# Patient Record
Sex: Female | Born: 1975 | Race: White | Hispanic: No | Marital: Married | Smoking: Current some day smoker
Health system: Southern US, Community
[De-identification: ages and names within clinical notes are randomized; demographics above are authoritative.]

## PROBLEM LIST (undated history)

## (undated) DIAGNOSIS — N939 Abnormal uterine and vaginal bleeding, unspecified: Secondary | ICD-10-CM

## (undated) HISTORY — PX: TONSILLECTOMY: SUR1361

## (undated) HISTORY — DX: Abnormal uterine and vaginal bleeding, unspecified: N93.9

## (undated) HISTORY — PX: PARATHYROIDECTOMY: SHX19

---

## 2014-02-19 ENCOUNTER — Other Ambulatory Visit: Payer: Self-pay

## 2014-02-19 DIAGNOSIS — Z1231 Encounter for screening mammogram for malignant neoplasm of breast: Secondary | ICD-10-CM

## 2014-03-05 ENCOUNTER — Ambulatory Visit: Payer: Self-pay

## 2015-05-20 ENCOUNTER — Emergency Department
Admission: EM | Admit: 2015-05-20 | Discharge: 2015-05-20 | Disposition: A | Discharge status: Home / Self Care | Payer: Worker's Compensation | Attending: Emergency Medicine | Admitting: Emergency Medicine

## 2015-05-20 ENCOUNTER — Emergency Department: Payer: Worker's Compensation

## 2015-05-20 ENCOUNTER — Encounter: Payer: Self-pay | Admitting: Emergency Medicine

## 2015-05-20 DIAGNOSIS — Y9389 Activity, other specified: Secondary | ICD-10-CM | POA: Diagnosis not present

## 2015-05-20 DIAGNOSIS — Y92531 Health care provider office as the place of occurrence of the external cause: Secondary | ICD-10-CM | POA: Insufficient documentation

## 2015-05-20 DIAGNOSIS — Z23 Encounter for immunization: Secondary | ICD-10-CM | POA: Diagnosis not present

## 2015-05-20 DIAGNOSIS — Y990 Civilian activity done for income or pay: Secondary | ICD-10-CM | POA: Insufficient documentation

## 2015-05-20 DIAGNOSIS — W460XXA Contact with hypodermic needle, initial encounter: Secondary | ICD-10-CM | POA: Diagnosis not present

## 2015-05-20 DIAGNOSIS — Z7721 Contact with and (suspected) exposure to potentially hazardous body fluids: Secondary | ICD-10-CM

## 2015-05-20 DIAGNOSIS — S71111A Laceration without foreign body, right thigh, initial encounter: Secondary | ICD-10-CM | POA: Insufficient documentation

## 2015-05-20 DIAGNOSIS — F17200 Nicotine dependence, unspecified, uncomplicated: Secondary | ICD-10-CM | POA: Diagnosis not present

## 2015-05-20 LAB — POCT PREGNANCY, URINE: Preg Test, Ur: NEGATIVE

## 2015-05-20 MED ORDER — TETANUS-DIPHTH-ACELL PERTUSSIS 5-2.5-18.5 LF-MCG/0.5 IM SUSP
0.5000 mL | Freq: Once | INTRAMUSCULAR | Status: AC
  Administered 2015-05-20: 0.5 mL via INTRAMUSCULAR
  Filled 2015-05-20: qty 0.5

## 2015-05-20 MED ORDER — BACITRACIN-NEOMYCIN-POLYMYXIN 400-5-5000 EX OINT
TOPICAL_OINTMENT | CUTANEOUS | Status: AC
  Filled 2015-05-20: qty 1

## 2015-05-20 MED ORDER — MUPIROCIN 2 % EX OINT: 1.0000 "application " | TOPICAL_OINTMENT | Freq: Two times a day (BID) | CUTANEOUS | Status: DC

## 2015-05-20 NOTE — ED Notes (Signed)
Pt completed w/c COC and gave urine specimen, this tech hand delivered specimen to lab and signed in; pt given her copy and employer copy of COC as well

## 2015-05-20 NOTE — Discharge Instructions (Signed)
Body Fluid Exposure Information °People may come into contact with blood and other body fluids under various circumstances. In some cases, body fluids may contain germs (bacteria or viruses) that cause infections. These germs can be spread when another person's body fluids come into contact with your skin, mouth, eyes, or genitals.  °Exposure to body fluids that may contain infectious material is a common problem for people providing care for others who are ill. It can occur when a person is performing health care tasks in the workplace or when taking care of a family member at home. Other common methods of exposure include injection drug use, sharing needles, and sexual activity. °The risk of an infection spreading through body fluid exposure is small and depends on a variety of factors. This includes the type of body fluid, the nature of the exposure, and the health status of the person who was the source of the body fluids. Your health care provider can help you assess the risk. °WHAT TYPES OF BODY FLUID CAN SPREAD INFECTION? °The following types of body fluid have the potential to spread infections: °· Blood. °· Semen. °· Vaginal secretions. °· Urine. °· Feces. °· Saliva. °· Nasal or eye discharge. °· Breast milk. °· Amniotic fluid and fluids surrounding body organs. °WHAT ARE SOME FIRST-AID MEASURES FOR BODY FLUID EXPOSURE? °The following steps should be taken as soon as possible after a person is exposed to body fluids: °Intact Skin °· For contact with closed skin, wash the area with soap and water. °Broken Skin °· For contact with broken skin (a wound), wash the area with soap and water. Let the area bleed a little. Then place a bandage or clean towel on the wound, applying gentle pressure to stop the bleeding. Do not squeeze or rub the area. °· Use just water or hand sanitizer if a sink with soap is not available. °· Do not use harsh chemicals such as bleach or iodine. °Eyes °· Rinse the eyes with water or  saline for 30 seconds. °· If the person is wearing contact lenses, leave the contact lenses in while rinsing the eyes. Once the rinsing is complete, remove the contact lenses. °Mouth °· Spit out the fluids. Rinse and spit with water 4-5 times. °In addition, you should remove any clothing that comes into contact with body fluids. However, if body fluid exposure results from sexual assault, seek medical care immediately without changing clothes or bathing. °WHEN SHOULD YOU SEEK HELP? °After performing the proper first-aid steps, you should contact your health care provider or seek emergency care right away if blood or other body fluids made contact with areas of broken skin or openings such as the eyes or mouth. If the exposure to body fluid happened in the workplace, you should report it to your work supervisor immediately. Many workplaces have procedures in place for exposure situations. °WHAT WILL HAPPEN AFTER YOU REPORT THE EXPOSURE? °Your health care provider will ask you several questions. Information requested may include: °· Your medical history, including vaccination records. °· Date and time of the exposure. °· Whether you saw body fluids during the exposure. °· Type of body fluid you were exposed to. °· Volume of body fluid you were exposed to. °· How the exposure happened. °· If any devices, such as a needle, were being used. °· Which area of your body made contact with the body fluid. °· Description of any injury to the skin or other area. °· How long contact was made with the body   fluid.  Any information you have about the health status of the person whose body fluid you were exposed to. The health care provider will assess your risk of infection. Often, no treatment is necessary. In some cases, the health care provider may recommend doing blood tests right away. Follow-up blood tests may also be done at certain intervals during the upcoming weeks and months to check for changes. You may be offered  treatment to prevent an infection from developing after exposure (post-exposure prophylaxis). This may include certain vaccinations or medicines and may be necessary when there is a risk of a serious infection, such as HIV or hepatitis B. Your health care provider should discuss appropriate treatment and vaccinations with you. HOW CAN YOU PREVENT EXPOSURE AND INFECTION? Always remember that prevention is the first line of defense against body fluid exposure. To help prevent exposure to body fluids:  Wash and disinfect countertops and other surfaces regularly.  Wear appropriate protective gear such as gloves, gowns, or eyewear when the possibility of exposure is present.  Wipe away spills of body fluid with disposable towels.  Properly dispose of blood products and other fluids. Use secured bags.  Properly dispose of needles and other instruments with sharp points or edges (sharps). Use closed, marked containers.  Avoid injection drug use.  Do not share needles.  Avoid recapping needles.  Use a condom during sexual intercourse.  Make sure you learn and follow any guidelines for preventing exposure (universal precautions) provided at your workplace. To help reduce your chances of getting an infection:  Make sure your vaccinations are up-to-date, including those for tetanus and hepatitis.  Wash your hands frequently with soap and water. Use hand sanitizers.  Avoid having multiple sex partners.  Follow up with your health care provider as directed after being evaluated for an exposure to body fluids. To avoid spreading infection to others:  Do not have sexual relations until you know you are free of infection.  Do not donate blood, plasma, breast milk, sperm, or other body fluids.  Do not share hygiene tools such as toothbrushes, razors, or dental floss.  Keep open wounds covered.  Dispose of any items with blood on them (razors, tampons, bandages) by putting them in the  trash.  Do not share drug supplies with others, such as needles, syringes, straws, or pipes.  Follow all of your health care provider's instructions for preventing the spread of infection.   This information is not intended to replace advice given to you by your health care provider. Make sure you discuss any questions you have with your health care provider.   Document Released: 09/14/2012 Document Revised: 01/17/2013 Document Reviewed: 09/14/2012 Elsevier Interactive Patient Education 2016 [employer] Inc[employer]  Laceration Care, Adult A laceration is a cut that goes through all of the layers of the skin and into the tissue that is right under the skin. Some lacerations heal on their own. Others need to be closed with stitches (sutures), staples, skin adhesive strips, or skin glue. Proper laceration care minimizes the risk of infection and helps the laceration to heal better. HOW TO CARE FOR YOUR LACERATION If sutures or staples were used:  Keep the wound clean and dry.  If you were given a bandage (dressing), you should change it at least one time per day or as told by your health care provider. You should also change it if it becomes wet or dirty.  Keep the wound completely dry for the first 24 hours or as told  by your health care provider. After that time, you may shower or bathe. However, make sure that the wound is not soaked in water until after the sutures or staples have been removed.  Clean the wound one time each day or as told by your health care provider:  Wash the wound with soap and water.  Rinse the wound with water to remove all soap.  Pat the wound dry with a clean towel. Do not rub the wound.  After cleaning the wound, apply a thin layer of antibiotic ointmentas told by your health care provider. This will help to prevent infection and keep the dressing from sticking to the wound.  Have the sutures or staples removed as told by your health care provider. If skin adhesive  strips were used:  Keep the wound clean and dry.  If you were given a bandage (dressing), you should change it at least one time per day or as told by your health care provider. You should also change it if it becomes dirty or wet.  Do not get the skin adhesive strips wet. You may shower or bathe, but be careful to keep the wound dry.  If the wound gets wet, pat it dry with a clean towel. Do not rub the wound.  Skin adhesive strips fall off on their own. You may trim the strips as the wound heals. Do not remove skin adhesive strips that are still stuck to the wound. They will fall off in time. If skin glue was used:  Try to keep the wound dry, but you may briefly wet it in the shower or bath. Do not soak the wound in water, such as by swimming.  After you have showered or bathed, gently pat the wound dry with a clean towel. Do not rub the wound.  Do not do any activities that will make you sweat heavily until the skin glue has fallen off on its own.  Do not apply liquid, cream, or ointment medicine to the wound while the skin glue is in place. Using those may loosen the film before the wound has healed.  If you were given a bandage (dressing), you should change it at least one time per day or as told by your health care provider. You should also change it if it becomes dirty or wet.  If a dressing is placed over the wound, be careful not to apply tape directly over the skin glue. Doing that may cause the glue to be pulled off before the wound has healed.  Do not pick at the glue. The skin glue usually remains in place for 5-10 days, then it falls off of the skin. General Instructions  Take over-the-counter and prescription medicines only as told by your health care provider.  If you were prescribed an antibiotic medicine or ointment, take or apply it as told by your doctor. Do not stop using it even if your condition improves.  To help prevent scarring, make sure to cover your wound  with sunscreen whenever you are outside after stitches are removed, after adhesive strips are removed, or when glue remains in place and the wound is healed. Make sure to wear a sunscreen of at least 30 SPF.  Do not scratch or pick at the wound.  Keep all follow-up visits as told by your health care provider. This is important.  Check your wound every day for signs of infection. Watch for:  Redness, swelling, or pain.  Fluid, blood, or pus.  Raise (elevate) the injured area above the level of your heart while you are sitting or lying down, if possible. SEEK MEDICAL CARE IF:  You received a tetanus shot and you have swelling, severe pain, redness, or bleeding at the injection site.  You have a fever.  A wound that was closed breaks open.  You notice a bad smell coming from your wound or your dressing.  You notice something coming out of the wound, such as wood or glass.  Your pain is not controlled with medicine.  You have increased redness, swelling, or pain at the site of your wound.  You have fluid, blood, or pus coming from your wound.  You notice a change in the color of your skin near your wound.  You need to change the dressing frequently due to fluid, blood, or pus draining from the wound.  You develop a new rash.  You develop numbness around the wound. SEEK IMMEDIATE MEDICAL CARE IF:  You develop severe swelling around the wound.  Your pain suddenly increases and is severe.  You develop painful lumps near the wound or on skin that is anywhere on your body.  You have a red streak going away from your wound.  The wound is on your hand or foot and you cannot properly move a finger or toe.  The wound is on your hand or foot and you notice that your fingers or toes look pale or bluish.   This information is not intended to replace advice given to you by your health care provider. Make sure you discuss any questions you have with your health care provider.    Document Released: 01/12/2005 Document Revised: 05/29/2014 Document Reviewed: 01/08/2014 Elsevier Interactive Patient Education [employer]

## 2015-05-20 NOTE — ED Provider Notes (Signed)
[facility] Emergency Department Provider Note  ____________________________________________  Time seen: Approximately 11:23 AM  I have reviewed the triage vital signs and the nursing notes.   HISTORY  Chief Complaint Puncture Wound    HPI Helen Curry is a 40 y.o. female , NAD, presents to the emergency department with complaint of a puncture wound to the right thigh. States she works in a [employer] office and walked into a used before. States that he "was used on the patient and had not been sanitizing that time. Also notes that she cannot find the needle from the bur but doesn't believe it is in her leg. Notes that her tetanus is not up-to-date. Has not had any numbness, weakness, tingling of the leg. Denies any previous exposures.   No past medical history on file.  There are no active problems to display for this patient.   Past Surgical History  Procedure Laterality Date  . Parathyroidectomy    . Tonsillectomy      Current Outpatient Rx  Name  Route  Sig  Dispense  Refill  . mupirocin ointment (BACTROBAN) 2 %   Topical   Apply 1 application topically 2 (two) times daily.   30 g   0     Allergies Review of patient's allergies indicates no known allergies.  No family history on file.  Social History Social History  Substance Use Topics  . Smoking status: Current Some Day Smoker  . Smokeless tobacco: None  . Alcohol Use: Yes     Review of Systems  Constitutional: No fever/chills, fatigue, no night sweats Cardiovascular: No chest pain. Respiratory: No shortness of breath. Musculoskeletal: Negative for right hip or leg pain.  Skin: Positive laceration to right thigh. Negative for rash. Neurological: Negative for headaches, focal weakness or numbness. No tingling 10-point ROS otherwise negative.  ____________________________________________   PHYSICAL EXAM:  VITAL SIGNS: ED Triage Vitals  Enc Vitals Group     BP 05/20/15  1106 114/75 mmHg     Pulse Rate 05/20/15 1106 65     Resp 05/20/15 1106 20     Temp 05/20/15 1106 97.9 F (36.6 C)     Temp Source 05/20/15 1106 Oral     SpO2 05/20/15 1106 98 %     Weight 05/20/15 1106 210 lb (95.255 kg)     Height 05/20/15 1106 5\' 10"  (1.778 m)     Head Cir --      Peak Flow --      Pain Score --      Pain Loc --      Pain Edu? --      Excl. in GC? --      Constitutional: Alert and oriented. Well appearing and in no acute distress. Eyes: Conjunctivae are normal. Head: Atraumatic. Neck: Supple with full range of motion. Hematological/Lymphatic/Immunilogical: No cervical lymphadenopathy. Cardiovascular: Normal rate, regular rhythm. Normal S1 and S2.  Good peripheral circulation with 2+ pulses noted in the right lower extremity. Respiratory: Normal respiratory effort without tachypnea or retractions. Lungs CTAB with breath sounds noted in all lung fields. Musculoskeletal: No lower extremity tenderness nor edema.  No joint effusions. Neurologic:  Normal speech and language. No gross focal neurologic deficits are appreciated.  Skin:  1 cm superficial laceration to the right anterior thigh. No active bleeding. No evidence of foreign body to palpation. Skin is warm, dry. No rash noted. Psychiatric: Mood and affect are normal. Speech and behavior are normal. Patient exhibits appropriate insight and judgement.  ____________________________________________   LABS (all labs ordered are listed, but only abnormal results are displayed)  Labs Reviewed  POCT PREGNANCY, URINE   ____________________________________________  EKG  None ____________________________________________  RADIOLOGY I have personally viewed and evaluated these images (plain radiographs) as part of my medical decision making, as well as reviewing the written report by the radiologist.  Dg Femur, Min 2 Views Right  05/20/2015  CLINICAL DATA:  Foreign body in proximal thigh. EXAM: RIGHT FEMUR 2  VIEWS COMPARISON:  None. FINDINGS: No acute fracture. No dislocation. No evidence of radiopaque foreign body in the soft tissues. IMPRESSION: No acute bony pathology. No evidence of radiopaque foreign body in the soft tissues. Electronically Signed   By: Jolaine Click M.D.   On: 05/20/2015 13:47    ____________________________________________    PROCEDURES  Procedure(s) performed: None    Medications  neomycin-bacitracin-polymyxin (NEOSPORIN) [id number] ointment (not administered)  Tdap (BOOSTRIX) injection 0.5 mL (0.5 mLs Intramuscular Given 05/20/15 1320)     ____________________________________________   INITIAL IMPRESSION / ASSESSMENT AND PLAN / ED COURSE  ----------------------------------------- 1:17 PM on 05/20/2015 -----------------------------------------  We have been notified that the source patient has been informed and is on her way to the emergency department for blood to be drawn.  ----------------------------------------- 3:05 PM on 05/20/2015 -----------------------------------------  Blood has been drawn from the source and has been sent to lab for evaluation.  Pertinent labs & imaging results that were available during my care of the patient were reviewed by me and considered in my medical decision making (see chart for details).  Patient's diagnosis is consistent with exposure to blood and laceration to right thigh. Patient will be discharged home with prescriptions for Bactroban to use as directed. Patient is to keep wound clean and dry. May wash with warm soapy water and apply ointment and a bandage up to twice daily.. Patient is to follow up with [facility] as needed. Patient is given ED precautions to return to the ED for any worsening or new symptoms.      ____________________________________________  FINAL CLINICAL IMPRESSION(S) / ED DIAGNOSES  Final diagnoses:  Exposure to blood  Laceration of right thigh without complication,  initial encounter      NEW MEDICATIONS STARTED DURING THIS VISIT:  New Prescriptions   MUPIROCIN OINTMENT (BACTROBAN) 2 %    Apply 1 application topically 2 (two) times daily.         Hope Pigeon, PA-C 05/20/15 1507  Myrna Blazer, MD 05/20/15 [phone]

## 2015-05-20 NOTE — ED Notes (Signed)
Working at [employer] office, walked into burr, punctured R thigh, states equipment had been used on patient already.

## 2015-05-20 NOTE — ED Notes (Signed)
Pt  Employer is [employer] [facility] [facility] [employer], no profile sheet on file contacted Rivka BarbaraGlenda the office manager @ [phone], who stated to please complete urine drug screen

## 2015-05-20 NOTE — ED Notes (Signed)
Pt source has resulted nonreactive. Patient has been counseled by provider and opts for non prophylaxis. Patient given follow up schedule for further testing. Discharged.

## 2015-05-20 NOTE — ED Notes (Signed)
POCT preg negative. 

## 2016-08-04 ENCOUNTER — Telehealth: Payer: Self-pay

## 2016-08-04 NOTE — Telephone Encounter (Signed)
Pt has appt Fri and would like to have lab orders put in so she can have them done that day also.  Left detailed msg that labs will be put in while she's here and if she is fasting she can go ahead an have labs drawn.

## 2016-08-07 ENCOUNTER — Encounter: Payer: Self-pay | Admitting: Obstetrics and Gynecology

## 2016-08-07 ENCOUNTER — Ambulatory Visit (INDEPENDENT_AMBULATORY_CARE_PROVIDER_SITE_OTHER): Payer: No Typology Code available for payment source | Admitting: Obstetrics and Gynecology

## 2016-08-07 VITALS — BP 112/76 | HR 73 | Ht 70.0 in | Wt 216.0 lb

## 2016-08-07 DIAGNOSIS — Z1231 Encounter for screening mammogram for malignant neoplasm of breast: Secondary | ICD-10-CM | POA: Diagnosis not present

## 2016-08-07 DIAGNOSIS — Z13220 Encounter for screening for lipoid disorders: Secondary | ICD-10-CM

## 2016-08-07 DIAGNOSIS — E892 Postprocedural hypoparathyroidism: Secondary | ICD-10-CM

## 2016-08-07 DIAGNOSIS — Z124 Encounter for screening for malignant neoplasm of cervix: Secondary | ICD-10-CM | POA: Diagnosis not present

## 2016-08-07 DIAGNOSIS — N920 Excessive and frequent menstruation with regular cycle: Secondary | ICD-10-CM

## 2016-08-07 DIAGNOSIS — Z01419 Encounter for gynecological examination (general) (routine) without abnormal findings: Secondary | ICD-10-CM

## 2016-08-07 DIAGNOSIS — Z131 Encounter for screening for diabetes mellitus: Secondary | ICD-10-CM | POA: Diagnosis not present

## 2016-08-07 DIAGNOSIS — Z9889 Other specified postprocedural states: Secondary | ICD-10-CM

## 2016-08-07 DIAGNOSIS — Z1239 Encounter for other screening for malignant neoplasm of breast: Secondary | ICD-10-CM

## 2016-08-07 DIAGNOSIS — Z01411 Encounter for gynecological examination (general) (routine) with abnormal findings: Secondary | ICD-10-CM

## 2016-08-07 DIAGNOSIS — Z9089 Acquired absence of other organs: Secondary | ICD-10-CM

## 2016-08-07 DIAGNOSIS — Z1329 Encounter for screening for other suspected endocrine disorder: Secondary | ICD-10-CM

## 2016-08-07 MED ORDER — PHENTERMINE HCL 37.5 MG PO TABS: 37.5000 mg | ORAL_TABLET | Freq: Every day | ORAL | 0 refills | Status: DC

## 2016-08-07 NOTE — Patient Instructions (Signed)
Preventive Care 40-64 Years, Female Preventive care refers to lifestyle choices and visits with your health care provider that can promote health and wellness. What does preventive care include?  A yearly physical exam. This is also called an annual well check.  Dental exams once or twice a year.  Routine eye exams. Ask your health care provider how often you should have your eyes checked.  Personal lifestyle choices, including: ? Daily care of your teeth and gums. ? Regular physical activity. ? Eating a healthy diet. ? Avoiding tobacco and drug use. ? Limiting alcohol use. ? Practicing safe sex. ? Taking low-dose aspirin daily starting at age 58. ? Taking vitamin and mineral supplements as recommended by your health care provider. What happens during an annual well check? The services and screenings done by your health care provider during your annual well check will depend on your age, overall health, lifestyle risk factors, and family history of disease. Counseling Your health care provider may ask you questions about your:  Alcohol use.  Tobacco use.  Drug use.  Emotional well-being.  Home and relationship well-being.  Sexual activity.  Eating habits.  Work and work Statistician.  Method of birth control.  Menstrual cycle.  Pregnancy history.  Screening You may have the following tests or measurements:  Height, weight, and BMI.  Blood pressure.  Lipid and cholesterol levels. These may be checked every 5 years, or more frequently if you are over 81 years old.  Skin check.  Lung cancer screening. You may have this screening every year starting at age 78 if you have a 30-pack-year history of smoking and currently smoke or have quit within the past 15 years.  Fecal occult blood test (FOBT) of the stool. You may have this test every year starting at age 65.  Flexible sigmoidoscopy or colonoscopy. You may have a sigmoidoscopy every 5 years or a colonoscopy  every 10 years starting at age 30.  Hepatitis C blood test.  Hepatitis B blood test.  Sexually transmitted disease (STD) testing.  Diabetes screening. This is done by checking your blood sugar (glucose) after you have not eaten for a while (fasting). You may have this done every 1-3 years.  Mammogram. This may be done every 1-2 years. Talk to your health care provider about when you should start having regular mammograms. This may depend on whether you have a family history of breast cancer.  BRCA-related cancer screening. This may be done if you have a family history of breast, ovarian, tubal, or peritoneal cancers.  Pelvic exam and Pap test. This may be done every 3 years starting at age 80. Starting at age 36, this may be done every 5 years if you have a Pap test in combination with an HPV test.  Bone density scan. This is done to screen for osteoporosis. You may have this scan if you are at high risk for osteoporosis.  Discuss your test results, treatment options, and if necessary, the need for more tests with your health care provider. Vaccines Your health care provider may recommend certain vaccines, such as:  Influenza vaccine. This is recommended every year.  Tetanus, diphtheria, and acellular pertussis (Tdap, Td) vaccine. You may need a Td booster every 10 years.  Varicella vaccine. You may need this if you have not been vaccinated.  Zoster vaccine. You may need this after age 5.  Measles, mumps, and rubella (MMR) vaccine. You may need at least one dose of MMR if you were born in  1959 or later. You may also need a second dose.  Pneumococcal 13-valent conjugate (PCV13) vaccine. You may need this if you have certain conditions and were not previously vaccinated.  Pneumococcal polysaccharide (PPSV23) vaccine. You may need one or two doses if you smoke cigarettes or if you have certain conditions.  Meningococcal vaccine. You may need this if you have certain  conditions.  Hepatitis A vaccine. You may need this if you have certain conditions or if you travel or work in places where you may be exposed to hepatitis A.  Hepatitis B vaccine. You may need this if you have certain conditions or if you travel or work in places where you may be exposed to hepatitis B.  Haemophilus influenzae type b (Hib) vaccine. You may need this if you have certain conditions.  Talk to your health care provider about which screenings and vaccines you need and how often you need them. This information is not intended to replace advice given to you by your health care provider. Make sure you discuss any questions you have with your health care provider. Document Released: 02/08/2015 Document Revised: 10/02/2015 Document Reviewed: 11/13/2014 Elsevier Interactive Patient Education  2017 [employer].

## 2016-08-07 NOTE — Progress Notes (Signed)
Patient ID: Helen FavreNichole Curry, female   DOB: 1975-03-14, 41 y.o.   MRN: [id number]     Gynecology Annual Exam  PCP: Patient, No Pcp Per  Chief Complaint:  Chief Complaint  Patient presents with  . Gynecologic Exam    irregularity/heavy flow    History of Present Illness: Patient is a 41 y.o. [id number] presents for annual exam. The patient has no complaints today.   LMP: Patient's last menstrual period was 07/29/2016 (exact date). Average Interval: irregular every 28-35 days Duration of flow: 8 days Heavy Menses: yes Clots: yes Intermenstrual Bleeding: no Postcoital Bleeding: no Dysmenorrhea: yes   The patient is sexually active. She currently uses rhythm method for contraception. She denies dyspareunia.  The patient does perform self breast exams.  There is no notable family history of breast or ovarian cancer in her family.  The patient wears seatbelts: yes.   The patient has regular exercise: not asked.    The patient denies current symptoms of depression.     Patientis a 41 y.o. [id number] female, who presents for the evaluation of weight gain. She has gained 20 pounds primarily over 2-3 years. The patient states the following issues have contributed to her weight problem: inactivity.  The patient has no additional symptoms. The patient specifically denies memory loss, muscle weakness, excessive thirst, and polyuria. Weight related co-morbidities include none. The patient's past medical history is notable for non-contributory. She has not tried interventions in the past.   Review of Systems: ROS  Past Medical History:  History reviewed. No pertinent past medical history.  Past Surgical History:  Past Surgical History:  Procedure Laterality Date  . PARATHYROIDECTOMY    . TONSILLECTOMY      Gynecologic History:  Patient's last menstrual period was 07/29/2016 (exact date). Contraception: none Last Pap: Results were: none on record Last mammogram: none on record Obstetric  History: [id number]  Family History:  History reviewed. No pertinent family history.  Social History:  Social History   Social History  . Marital status: Married    Spouse name: N/A  . Number of children: N/A  . Years of education: N/A   Occupational History  . Not on file.   Social History Main Topics  . Smoking status: Current Some Day Smoker    Types: E-cigarettes  . Smokeless tobacco: Never Used  . Alcohol use Yes  . Drug use: No  . Sexual activity: Yes    Partners: Male    Birth control/ protection: Surgical     Comment: Vaesctomy   Other Topics Concern  . Not on file   Social History Narrative  . No narrative on file    Allergies:  No Known Allergies  Medications: Prior to Admission medications   Not on File    Physical Exam Vitals: Blood pressure 112/76, pulse 73, height 5\' 10"  (1.778 m), weight 216 lb (98 kg), last menstrual period 07/29/2016.  General: NAD HEENT: normocephalic, anicteric Thyroid: no enlargement, no palpable nodules Pulmonary: No increased work of breathing, CTAB Cardiovascular: RRR, distal pulses 2+ Breast: Breast symmetrical, no tenderness, no palpable nodules or masses, no skin or nipple retraction present, no nipple discharge.  No axillary or supraclavicular lymphadenopathy. Abdomen: NABS, soft, non-tender, non-distended.  Umbilicus without lesions.  No hepatomegaly, splenomegaly or masses palpable. No evidence of hernia  Genitourinary:  External: Normal external female genitalia.  Normal urethral meatus, normal  Bartholin's and Skene's glands.    Vagina: Normal vaginal mucosa, no evidence of prolapse.    Cervix:  Grossly normal in appearance, no bleeding  Uterus: Non-enlarged, mobile, normal contour.  No CMT  Adnexa: ovaries non-enlarged, no adnexal masses  Rectal: deferred  Lymphatic: no evidence of inguinal lymphadenopathy Extremities: no edema, erythema, or tenderness Neurologic: Grossly intact Psychiatric: mood appropriate,  affect full  Female chaperone present for pelvic and breast  portions of the physical exam    Assessment: 41 y.o. [id number] No problem-specific Assessment & Plan notes found for this encounter.   Plan: Problem List Items Addressed This Visit    None    Visit Diagnoses    Diabetes mellitus screening    -  Primary   Relevant Orders   CMP14+LP+TP+TSH+CBC/Plt (Completed)   Screening for malignant neoplasm of cervix       Relevant Orders   PapIG, HPV, rfx 16/18   Breast screening       Relevant Orders   MM DIGITAL SCREENING BILATERAL   Encounter for gynecological examination without abnormal finding       Relevant Orders   PapIG, HPV, rfx 16/18   CMP14+LP+TP+TSH+CBC/Plt (Completed)   Calcium, ionized (Completed)   Screening for cholesterol level       Relevant Orders   CMP14+LP+TP+TSH+CBC/Plt (Completed)   Thyroid disorder screen       Relevant Orders   CMP14+LP+TP+TSH+CBC/Plt (Completed)   Menorrhagia with regular cycle       Relevant Orders   CMP14+LP+TP+TSH+CBC/Plt (Completed)   Prolactin (Completed)   US Transvaginal Non-OB   S/P parathyroidectomy (HCC)       Relevant Orders   Calcium, ionized (Completed)     Annual Exam 1) Mammogram - recommend yearly screening mammogram.  Mammogram Was ordered today   2) STI screening was not offered  3) ASCCP guidelines and rational discussed.  Patient opts for every 3 years screening interval  4) Contraception - Education given regarding options for contraception, discussed progestin only options including IUD if normal work up for bleeding  5) Colonoscopy -- Screening recommended starting at age 63 for average risk individuals, age 58 for individuals deemed at increased risk (including African Americans) and recommended to continue until age 29.  For patient age 44-85 individualized approach is recommended.  Gold standard screening is via colonoscopy, Cologuard screening is an acceptable alternative for patient unwilling or  unable to undergo colonoscopy.  "Colorectal cancer screening for average?risk adults: 2018 guideline update from the American Cancer Society"[place]: A Cancer Journal for Clinicians: Jun 24, 2016   6) Routine healthcare maintenance including cholesterol, diabetes screening discussed Ordered today  7) Normal SIS May 2016  Medical Weight Loss Management 1) 1500 Calorie ADA Diet  2) Patient education given regarding appropriate lifestyle changes for weight loss including: regular physical activity, healthy coping strategies, caloric restriction and healthy eating patterns.  3) Patient will be started on weight loss medication. The risks and benefits and side effects of medication, such as Adipex (Phenteramine) ,  Tenuate (Diethylproprion), Belviq (lorcarsin), Contrave (buproprion/naltrexone), Qsymia (phentermine/topiramate), and Saxenda (liraglutide) is discussed. The pros and cons of suppressing appetite and boosting metabolism is discussed. Risks of tolerence and addiction is discussed for selected agents discussed. Use of medicine will ne short term, such as 3-4 months at a time followed by a period of time off of the medicine to avoid these risks and side effects for Adipex, Qsymia, and Tenuate discussed. Pt to call with any negative side effects and agrees to keep follow up appts.  4) Comorbidity Screening - hypothyroidism screening, diabetes, and hyperlipidemia screening offered  5) Encouraged  weekly weight monitorig to track progress and sample 1 week food diary  6) Contraception - discussed that all weight loss drugs fall in to pregnancy category X  7) 15 minutes face-to-face; counseling/coordination of care > 50 percent of visit  8) Follow up in 4 weeks to assess response

## 2016-08-08 LAB — CMP14+LP+TP+TSH+CBC/PLT
A/G RATIO: 1.7 (ref 1.2–2.2)
ALBUMIN: 4.3 g/dL (ref 3.5–5.5)
ALK PHOS: 67 IU/L (ref 39–117)
ALT: 12 IU/L (ref 0–32)
AST: 17 IU/L (ref 0–40)
BILIRUBIN TOTAL: 0.7 mg/dL (ref 0.0–1.2)
BUN/Creatinine Ratio: 17 (ref 9–23)
BUN: 13 mg/dL (ref 6–24)
CHLORIDE: 100 mmol/L (ref 96–106)
CO2: 24 mmol/L (ref 20–29)
CREATININE: 0.75 mg/dL (ref 0.57–1.00)
Calcium: 9.5 mg/dL (ref 8.7–10.2)
Cholesterol, Total: 184 mg/dL (ref 100–199)
Free Thyroxine Index: 2 (ref 1.2–4.9)
GFR calc non Af Amer: 99 mL/min/{1.73_m2} (ref >59)
GFR, EST AFRICAN AMERICAN: 114 mL/min/{1.73_m2} (ref >59)
GLUCOSE: 88 mg/dL (ref 65–99)
Globulin, Total: 2.6 g/dL (ref 1.5–4.5)
HDL: 57 mg/dL (ref >39)
HEMATOCRIT: 32 % — AB (ref 34.0–46.6)
HEMOGLOBIN: 9.4 g/dL — AB (ref 11.1–15.9)
LDL CALC: 110 mg/dL — AB (ref 0–99)
LDL/HDL RATIO: 1.9 ratio (ref 0.0–3.2)
MCH: 22.8 pg — AB (ref 26.6–33.0)
MCHC: 29.4 g/dL — AB (ref 31.5–35.7)
MCV: 78 fL — AB (ref 79–97)
POTASSIUM: 4.2 mmol/L (ref 3.5–5.2)
Platelets: 317 10*3/uL (ref 150–379)
RBC: 4.12 x10E6/uL (ref 3.77–5.28)
RDW: 17.5 % — AB (ref 12.3–15.4)
Sodium: 140 mmol/L (ref 134–144)
T3 Uptake Ratio: 24 % (ref 24–39)
T4 TOTAL: 8.5 ug/dL (ref 4.5–12.0)
TOTAL PROTEIN: 6.9 g/dL (ref 6.0–8.5)
TSH: 1.82 u[IU]/mL (ref 0.450–4.500)
Triglycerides: 86 mg/dL (ref 0–149)
VLDL CHOLESTEROL CAL: 17 mg/dL (ref 5–40)
WBC: 4.9 10*3/uL (ref 3.4–10.8)

## 2016-08-08 LAB — CALCIUM, IONIZED: Calcium, Ion: 5 mg/dL (ref 4.5–5.6)

## 2016-08-08 LAB — PROLACTIN: PROLACTIN: 23.8 ng/mL — AB (ref 4.8–23.3)

## 2016-08-11 LAB — PAPIG, HPV, RFX 16/18
HPV, high-risk: NEGATIVE
PAP Smear Comment: 0

## 2016-08-21 ENCOUNTER — Ambulatory Visit
Admission: RE | Admit: 2016-08-21 | Discharge: 2016-08-21 | Disposition: A | Discharge status: Home / Self Care | Payer: Commercial Indemnity | Source: Ambulatory Visit | Attending: Obstetrics and Gynecology | Admitting: Obstetrics and Gynecology

## 2016-08-21 DIAGNOSIS — Z1231 Encounter for screening mammogram for malignant neoplasm of breast: Secondary | ICD-10-CM | POA: Diagnosis not present

## 2016-08-21 DIAGNOSIS — Z1239 Encounter for other screening for malignant neoplasm of breast: Secondary | ICD-10-CM

## 2016-08-26 ENCOUNTER — Other Ambulatory Visit: Payer: Self-pay | Admitting: *Deleted

## 2016-08-26 ENCOUNTER — Inpatient Hospital Stay
Admission: RE | Admit: 2016-08-26 | Discharge: 2016-08-26 | Disposition: A | Discharge status: Home / Self Care | Payer: Self-pay | Source: Ambulatory Visit | Attending: *Deleted | Admitting: *Deleted

## 2016-08-26 DIAGNOSIS — Z9289 Personal history of other medical treatment: Secondary | ICD-10-CM

## 2016-09-08 ENCOUNTER — Ambulatory Visit (INDEPENDENT_AMBULATORY_CARE_PROVIDER_SITE_OTHER): Payer: No Typology Code available for payment source

## 2016-09-08 ENCOUNTER — Encounter: Payer: Self-pay | Admitting: Obstetrics and Gynecology

## 2016-09-08 ENCOUNTER — Ambulatory Visit (INDEPENDENT_AMBULATORY_CARE_PROVIDER_SITE_OTHER): Payer: No Typology Code available for payment source | Admitting: Obstetrics and Gynecology

## 2016-09-08 VITALS — BP 120/70 | HR 72 | Ht 70.0 in | Wt 212.0 lb

## 2016-09-08 DIAGNOSIS — N801 Endometriosis of ovary: Secondary | ICD-10-CM

## 2016-09-08 DIAGNOSIS — N80129 Deep endometriosis of ovary, unspecified ovary: Secondary | ICD-10-CM

## 2016-09-08 DIAGNOSIS — Z6830 Body mass index (BMI) 30.0-30.9, adult: Secondary | ICD-10-CM

## 2016-09-08 DIAGNOSIS — N920 Excessive and frequent menstruation with regular cycle: Secondary | ICD-10-CM | POA: Diagnosis not present

## 2016-09-08 DIAGNOSIS — R938 Abnormal findings on diagnostic imaging of other specified body structures: Secondary | ICD-10-CM | POA: Diagnosis not present

## 2016-09-08 DIAGNOSIS — N939 Abnormal uterine and vaginal bleeding, unspecified: Secondary | ICD-10-CM

## 2016-09-08 DIAGNOSIS — E669 Obesity, unspecified: Secondary | ICD-10-CM

## 2016-09-08 DIAGNOSIS — R9389 Abnormal findings on diagnostic imaging of other specified body structures: Secondary | ICD-10-CM

## 2016-09-08 MED ORDER — NORETHINDRONE ACETATE 5 MG PO TABS: 5.0000 mg | ORAL_TABLET | Freq: Every day | ORAL | 11 refills | Status: DC

## 2016-09-08 MED ORDER — PHENTERMINE HCL 37.5 MG PO TABS: 37.5000 mg | ORAL_TABLET | Freq: Every day | ORAL | 0 refills | Status: DC

## 2016-09-08 NOTE — Progress Notes (Signed)
Gynecology Ultrasound Follow Up  Chief Complaint:  Chief Complaint  Patient presents with  . Follow-up    GYN u/s     History of Present Illness: Patient is a 41 y.o. female who presents today for ultrasound evaluation of abnormal uterine bleeding .  Ultrasound demonstrates the following findgins Adnexa: left ovar with 2.95x2.05cm and 2.59x1.87cm endometrioma vs hemorrhagic cysts Uterus: Non-enlarged with homogenous endometrial stripe 11mm, indistinct endometrial myometrial interface Additional: no free fluid  Patientis a 41 y.o. [id number] female, who presents for the evaluation of the desire to lose weight. She has lost 4  pounds. The patient states the following symptoms since starting her weight loss therapy: appetite suppression, energy, and weight loss.  The patient also reports no other ill effects. The patient specifically denies heart palpitations, anxiety, and insomnia.    Review of Systems: Review of Systems  Constitutional: Negative for chills and fever.  HENT: Negative for congestion.   Respiratory: Negative for cough and shortness of breath.   Cardiovascular: Negative for chest pain and palpitations.  Gastrointestinal: Negative for abdominal pain, constipation, diarrhea, heartburn, nausea and vomiting.  Genitourinary: Negative for dysuria, frequency and urgency.  Skin: Negative for itching and rash.  Neurological: Negative for dizziness and headaches.  Endo/Heme/Allergies: Negative for polydipsia.  Psychiatric/Behavioral: Negative for depression.    Past Medical History:  Past Medical History:  Diagnosis Date  . Abnormal uterine bleeding     Past Surgical History:  Past Surgical History:  Procedure Laterality Date  . PARATHYROIDECTOMY    . TONSILLECTOMY      Gynecologic History:  Patient's last menstrual period was 08/19/2016 (approximate).  Family History:  Family History  Problem Relation Age of Onset  . Dementia Father     Social History:    Social History   Social History  . Marital status: Married    Spouse name: N/A  . Number of children: N/A  . Years of education: N/A   Occupational History  . Not on file.   Social History Main Topics  . Smoking status: Current Some Day Smoker    Types: E-cigarettes  . Smokeless tobacco: Never Used  . Alcohol use Yes  . Drug use: No  . Sexual activity: Yes    Partners: Male    Birth control/ protection: Surgical     Comment: Vaesctomy   Other Topics Concern  . Not on file   Social History Narrative  . No narrative on file    Allergies:  No Known Allergies  Medications: Prior to Admission medications   Medication Sig Start Date End Date Taking? Authorizing Provider  phentermine (ADIPEX-P) 37.5 MG tablet Take 1 tablet (37.5 mg total) by mouth daily before breakfast. 08/07/16  Yes Vena AustriaStaebler, Ceceilia Cephus, MD    Physical Exam Vitals: Blood pressure 120/70, pulse 72, height 5\' 10"  (1.778 m), weight 212 lb (96.2 kg), last menstrual period 08/19/2016. Body mass index is 30.42 kg/m. Weight change - 4lbs  General: NAD HEENT: normocephalic, anicteric Pulmonary: No increased work of breathing Extremities: no edema, erythema, or tenderness Neurologic: Grossly intact, normal gait Psychiatric: mood appropriate, affect full  ENDOMETRIAL BIOPSY     The indications for endometrial biopsy were reviewed.   Risks of the biopsy including cramping, bleeding, infection, uterine perforation, inadequate specimen and need for additional procedures  were discussed. The patient states she understands and agrees to undergo procedure today. Consent was signed. Time out was performed. Urine HCG was negative. A Graves speculum was placed and the cervix  was brought into view.  The cervix was prepped with Betadine. A single-toothed tenaculum was placed on the anterior lip of the cervix for traction. A 3 mm pipelle was introduced through the cervix into the endometrial cavity without difficulty to a  depth of 9, and a moderate amount of tissue was obtained, the resulting specime sent to pathology. The instruments were removed from the patient's vagina. Minimal bleeding from the cervix was noted. The patient tolerated the procedure well. Routine post-procedure instructions were given to the patient.  She will be contacted by phone one results become available.     Assessment: 41 y.o. [id number] AUB with ultrasound suggestive of possible adenomyosis and endometriosis  Plan: Problem List Items Addressed This Visit    None    Visit Diagnoses    Thickened endometrium    -  Primary   Relevant Orders   US Transvaginal Non-OB   Pathology Report   Abnormal uterine bleeding       Relevant Orders   US Transvaginal Non-OB   Pathology Report   Endometrioma of ovary       Relevant Orders   US Transvaginal Non-OB   Class 1 obesity without serious comorbidity with body mass index (BMI) of 30.0 to 30.9 in adult, unspecified obesity type       Relevant Medications   phentermine (ADIPEX-P) 37.5 MG tablet      1) Endometriosis/adenomyosis - she is relatively asymptomatic outside of some dysmenorrhea.  We discussed mainstay of treatment is cycle suppression, given over 15 years of age we discussed CDC and WHO contraception guidelines recommending against the use of estrogen.  We also discussed endometrial ablation, Mirena IUD,hysterectomy, and depo leupron.  In regards to ablation I noted increased failure rates in patient with significant adenomyosis.  She is concerned with weight gain so will start off on norethindrone 5mg  daily.  Given age endometrial biopsy obtained today as well.  We will assess response in 6 months and document stability of endometriomas,  2) 4lbs weight loss continued phentermine  3) A total of 15 minutes were spent in face-to-face contact with the patient during this encounter with over half of that time devoted to counseling and coordination of care.

## 2016-09-08 NOTE — Patient Instructions (Signed)
Endometriosis Endometriosis is a condition in which the tissue that lines the uterus (endometrium) grows outside of its normal location. The tissue may grow in many locations close to the uterus, but it commonly grows on the ovaries, fallopian tubes, vagina, or bowel. When the uterus sheds the endometrium every menstrual cycle, there is bleeding wherever the endometrial tissue is located. This can cause pain because blood is irritating to tissues that are not normally exposed to it. What are the causes? The cause of endometriosis is not known. What increases the risk? You may be more likely to develop endometriosis if you:  Have a family history of endometriosis.  Have never given birth.  Started your period at age 10 or younger.  Have high levels of estrogen in your body.  Were exposed to a certain medicine (diethylstilbestrol) before you were born (in utero).  Had low birth weight.  Were born as a twin, triplet, or other multiple.  Have a BMI of less than 25. BMI is an estimate of body fat and is calculated from height and weight.  What are the signs or symptoms? Often, there are no symptoms of this condition. If you do have symptoms, they may:  Vary depending on where your endometrial tissue is growing.  Occur during your menstrual period (most common) or midcycle.  Come and go, or you may go months with no symptoms at all.  Stop with menopause.  Symptoms may include:  Pain in the back or abdomen.  Heavier bleeding during periods.  Pain during sex.  Painful bowel movements.  Infertility.  Pelvic pain.  Bleeding more than once a month.  How is this diagnosed? This condition is diagnosed based on your symptoms and a physical exam. You may have tests, such as:  Blood tests and urine tests. These may be done to help rule out other possible causes of your symptoms.  Ultrasound, to look for abnormal tissues.  An X-ray of the lower bowel (barium enema).  An  ultrasound that is done through the vagina (transvaginally).  CT scan.  MRI.  Laparoscopy. In this procedure, a lighted, pencil-sized instrument called a laparoscope is inserted into your abdomen through an incision. The laparoscope allows your health care provider to look at the organs inside your body and check for abnormal tissue to confirm the diagnosis. If abnormal tissue is found, your health care provider may remove a small piece of tissue (biopsy) to be examined under a microscope.  How is this treated? Treatment for this condition may include:  Medicines to relieve pain, such as NSAIDs.  Hormone therapy. This involves using artificial (synthetic) hormones to reduce endometrial tissue growth. Your health care provider may recommend using a hormonal form of birth control, or other medicines.  Surgery. This may be done to remove abnormal endometrial tissue. ? In some cases, tissue may be removed using a laparoscope and a laser (laparoscopic laser treatment). ? In severe cases, surgery may be done to remove the fallopian tubes, uterus, and ovaries (hysterectomy).  Follow these instructions at home:  Take over-the-counter and prescription medicines only as told by your health care provider.  Do not drive or use heavy machinery while taking prescription pain medicine.  Try to avoid activities that cause pain, including sexual activity.  Keep all follow-up visits as told by your health care provider. This is important. Contact a health care provider if:  You have pain in the area between your hip bones (pelvic area) that occurs: ? Before, during, or   after your period. ? In between your period and gets worse during your period. ? During or after sex. ? With bowel movements or urination, especially during your period.  You have problems getting pregnant.  You have a fever. Get help right away if:  You have severe pain that does not get better with medicine.  You have severe  nausea and vomiting, or you cannot eat without vomiting.  You have pain that affects only the lower, right side of your abdomen.  You have abdominal pain that gets worse.  You have abdominal swelling.  You have blood in your stool. This information is not intended to replace advice given to you by your health care provider. Make sure you discuss any questions you have with your health care provider. Document Released: 01/10/2000 Document Revised: 10/18/2015 Document Reviewed: 06/15/2015 Elsevier Interactive Patient Education  2018 [employer] Inc[employer]  

## 2016-09-10 LAB — PATHOLOGY

## 2016-10-09 ENCOUNTER — Ambulatory Visit: Payer: No Typology Code available for payment source | Admitting: Obstetrics and Gynecology

## 2016-10-21 ENCOUNTER — Encounter: Payer: Self-pay | Admitting: Obstetrics and Gynecology

## 2016-10-21 ENCOUNTER — Ambulatory Visit (INDEPENDENT_AMBULATORY_CARE_PROVIDER_SITE_OTHER): Payer: No Typology Code available for payment source | Admitting: Obstetrics and Gynecology

## 2016-10-21 VITALS — BP 120/70 | Ht 70.0 in | Wt 211.0 lb

## 2016-10-21 DIAGNOSIS — Z6830 Body mass index (BMI) 30.0-30.9, adult: Secondary | ICD-10-CM

## 2016-10-21 DIAGNOSIS — E669 Obesity, unspecified: Secondary | ICD-10-CM | POA: Diagnosis not present

## 2016-10-21 MED ORDER — PHENTERMINE HCL 37.5 MG PO TABS: 37.5000 mg | ORAL_TABLET | Freq: Every day | ORAL | 0 refills | Status: AC

## 2016-10-21 NOTE — Progress Notes (Signed)
Gynecology Office Visit  Chief Complaint:  Chief Complaint  Patient presents with  . Weight Check  . Menometrorrhagia    bleeding since starting medication    History of Present Illness: Patientis a 41 y.o. [id number] female, who presents for the evaluation of the desire to lose weight. She has lost 1 pounds for a 2 month total weight loss of 5lbs. The patient states the following symptoms since starting her weight loss therapy: appetite suppression, energy, and weight loss.  The patient also reports no other ill effects. The patient specifically denies heart palpitations, anxiety, and insomnia.    Review of Systems: 10 point review of systems negative unless otherwise noted in HPI  Past Medical History:  Past Medical History:  Diagnosis Date  . Abnormal uterine bleeding     Past Surgical History:  Past Surgical History:  Procedure Laterality Date  . PARATHYROIDECTOMY    . TONSILLECTOMY      Gynecologic History: Patient's last menstrual period was 09/20/2016.  Obstetric History: [id number]  Family History:  Family History  Problem Relation Age of Onset  . Dementia Father     Social History:  Social History   Social History  . Marital status: Married    Spouse name: N/A  . Number of children: N/A  . Years of education: N/A   Occupational History  . Not on file.   Social History Main Topics  . Smoking status: Current Some Day Smoker    Types: E-cigarettes  . Smokeless tobacco: Never Used  . Alcohol use Yes  . Drug use: No  . Sexual activity: Yes    Partners: Male    Birth control/ protection: Surgical     Comment: Vaesctomy   Other Topics Concern  . Not on file   Social History Narrative  . No narrative on file    Allergies:  No Known Allergies  Medications: Prior to Admission medications   Medication Sig Start Date End Date Taking? Authorizing Provider  norethindrone (AYGESTIN) 5 MG tablet Take 1 tablet (5 mg total) by mouth daily. 09/08/16   Yes Vena Austria, MD  phentermine (ADIPEX-P) 37.5 MG tablet Take 1 tablet (37.5 mg total) by mouth daily before breakfast. 09/08/16  Yes Vena Austria, MD    Physical Exam Blood pressure 120/70, height  (1.778 m), weight 211 lb (95.7 kg), last menstrual period 09/20/2016. Body mass index is 30.28 kg/m.  General: NAD HEENT: normocephalic, anicteric Thyroid: no enlargement Pulmonary: no increased work of breathing Neurologic: Grossly intact Psychiatric: mood appropriate, affect full  Assessment: 41 y.o. [id number] No problem-specific Assessment & Plan notes found for this encounter.   Plan: Problem List Items Addressed This Visit    None    Visit Diagnoses    Class 1 obesity without serious comorbidity with body mass index (BMI) of 30.0 to 30.9 in adult, unspecified obesity type    -  Primary   Relevant Medications   phentermine (ADIPEX-P) 37.5 MG tablet      1) 1500 Calorie ADA Diet  2) Patient education given regarding appropriate lifestyle changes for weight loss including: regular physical activity, healthy coping strategies, caloric restriction and healthy eating patterns.  3) Patient will be started on weight loss medication. The risks and benefits and side effects of medication, such as Adipex (Phenteramine) ,  Tenuate (Diethylproprion), Belviq (lorcarsin), Contrave (buproprion/naltrexone), Qsymia (phentermine/topiramate), and Saxenda (liraglutide) is discussed. The pros and cons of suppressing appetite and boosting metabolism is discussed. Risks of tolerence and  addiction is discussed for selected agents discussed. Use of medicine will ne short term, such as 3-4 months at a time followed by a period of time off of the medicine to avoid these risks and side effects for Adipex, Qsymia, and Tenuate discussed. Pt to call with any negative side effects and agrees to keep follow up appts.  4) Patient to take medication, with the benefits of appetite suppression and  metabolism boost d/w pt, along with the side effects and risk factors of long term use that will be avoided with our use of short bursts of therapy. Rx provided.    5) 15 minutes face-to-face; with counseling/coordination of care > 50 percent of visit related to obesity and ongoing management/treatment   6) Follow up in 4 weeks to assess response, at that time decision on cessation for 3 months vs starting a long term weight loss medication such as contrave or belviq

## 2016-10-23 ENCOUNTER — Encounter: Payer: Self-pay | Admitting: Obstetrics and Gynecology

## 2016-10-23 NOTE — Telephone Encounter (Signed)
Patient states she saw AMS a few days ago. She has messaged AMS thru my chart & is aware the message has been forwarded to him. She started norethindrone 1.5 mos ago & AMS had her stop it a few days ago. She is having severe cramping & nausea & is very uncomformtable. Inquiring if needs to be seen or advice on what can be done. [phone]

## 2016-10-26 ENCOUNTER — Telehealth: Payer: Self-pay

## 2016-10-26 NOTE — Telephone Encounter (Signed)
Pt's [facility] called after hour nurse 10/23/16 9:13pm c/o pt stopped norethindrone on Wed and experiencing bad cramps c 7/10 and constant. Ibprophen and midol not helping, anything else she should take?  [phone] ([facility])  Pt sent AMS email that cramping stopped late Sat pm and requesting appt c AMS to discuss options.

## 2016-10-27 ENCOUNTER — Telehealth: Payer: Self-pay | Admitting: Obstetrics and Gynecology

## 2016-10-27 NOTE — Telephone Encounter (Signed)
-----   Message from Vena Austria, MD sent at 10/26/2016  5:27 PM EDT ----- Needs follow up appointment consultation regarding surgical options

## 2016-10-27 NOTE — Telephone Encounter (Signed)
Pt is schedule with AMS on 11/06/16.

## 2016-11-06 ENCOUNTER — Encounter: Payer: Self-pay | Admitting: Obstetrics and Gynecology

## 2016-11-06 ENCOUNTER — Ambulatory Visit (INDEPENDENT_AMBULATORY_CARE_PROVIDER_SITE_OTHER): Payer: No Typology Code available for payment source | Admitting: Obstetrics and Gynecology

## 2016-11-06 VITALS — BP 124/68 | HR 72 | Ht 70.0 in | Wt 207.0 lb

## 2016-11-06 DIAGNOSIS — N83202 Unspecified ovarian cyst, left side: Secondary | ICD-10-CM

## 2016-11-06 DIAGNOSIS — N946 Dysmenorrhea, unspecified: Secondary | ICD-10-CM

## 2016-11-06 DIAGNOSIS — N921 Excessive and frequent menstruation with irregular cycle: Secondary | ICD-10-CM | POA: Diagnosis not present

## 2016-11-06 NOTE — Progress Notes (Signed)
Obstetrics & Gynecology Office Visit   Chief Complaint:  Chief Complaint  Patient presents with  . discuss surgery    irregular heavy bleeding w/clots    History of Present Illness: [name] is a 41 y.o. caucasian female seeing me in follow up today for heavy menstrual bleeding.  She has completed work up for this bleeding which was negative.  She was trial on norethindrone and continued to have bleeding on this regimen.  Norethindrone was chosen in attempts to achieve amenorrhea but also because imaging has shown what appeared to be a left endometrioma(s).  Given continued symptoms she is interested in exploring surgical options available to her and is accompanied by her husband today.  She still has not noted any significant pelvic pain aside from her dysmenorrhea.  Bleeding has actually stopped in the last few days.     Review of Systems: Review of systems negative unless otherwise noted in HPI  Past Medical History:  Past Medical History:  Diagnosis Date  . Abnormal uterine bleeding     Past Surgical History:  Past Surgical History:  Procedure Laterality Date  . PARATHYROIDECTOMY    . TONSILLECTOMY      Gynecologic History: Patient's last menstrual period was 10/26/2016.  Obstetric History: [id number]  Family History:  Family History  Problem Relation Age of Onset  . Dementia Father     Social History:  Social History   Social History  . Marital status: Married    Spouse name: N/A  . Number of children: N/A  . Years of education: N/A   Occupational History  . Not on file.   Social History Main Topics  . Smoking status: Current Some Day Smoker    Types: E-cigarettes  . Smokeless tobacco: Never Used  . Alcohol use Yes  . Drug use: No  . Sexual activity: Yes    Partners: Male    Birth control/ protection: Surgical     Comment: Vaesctomy   Other Topics Concern  . Not on file   Social History Narrative  . No narrative on file    Allergies:    No Known Allergies  Medications: Prior to Admission medications   Medication Sig Start Date End Date Taking? Authorizing Provider  phentermine (ADIPEX-P) 37.5 MG tablet Take 1 tablet (37.5 mg total) by mouth daily before breakfast. 10/21/16  Yes Vena Austria, MD    Physical Exam Vitals:  Vitals:   11/06/16 1350  BP: 124/68  Pulse: 72   Patient's last menstrual period was 10/26/2016.  General: NAD HEENT: normocephalic, anicteric Pulmonary: No increased work of breathing Neurologic: Grossly intact Psychiatric: mood appropriate, affect full  Female chaperone present for pelvic and breast  portions of the physical exam  Assessment: 41 y.o. [id number] with persistens menorrhagia and dysmenorrhea despite attempts at medical management  Plan: Problem List Items Addressed This Visit    None    Visit Diagnoses    Left ovarian cyst    -  Primary   Menorrhagia with irregular cycle       Dysmenorrhea         I discussed management option with the patient and her husband at great length today.  She has failed conservative medical management and continues to have bleeding on norethindrone.  Preoperative evaluation include normal pap smear on 08/07/2016 as well as normal preoperative ultrasound on 09/08/2016 with endometrial biopsy at that time showing secretory endometrium with benign endometrial polyp fragments (no evidence of polyp on ultrasound).  Ultrasound did show evidence of a hemorrhagic cyst vs endometriomas measuring 2.95x2.05cm and 2.59x1.87cm left of the left ovary.  We discussed that in regard to endometrial ablation, hysterectomy carries with it increased surgical risk and recovery time.  It is however definitive in treatment of heavy menstrual bleeding.  I also discussed that if she does in fact have endometriosis the surgical risk of injury to adjacent organs increases with the degree of adhesions encountered.  The patient is most interested in proceeding with endometrial  ablation with concurrent laparoscopy to evaluate the left ovarian cysts. The patient was counseled on the overall effectiveness of endometrial ablation in achieving amenorrhea.  She is aware that some patient may continue to have menstrual cycles although these are generally greatly reduced in flow.  In addition she was quoted a failure rate for endometrial ablation of approximately 25% within the frist 4 years, but these failures may happen at any time during or after the initial 4 year postop period.  She is aware that pregnancy is contra-indicated in the setting of prior endometrial ablation, and that ablation itself does not confer any contraceptive benefit.  She will therefore need to continue to rely on some means of contraception following the procedure.  Although rare and generally confined to patient who have undergone prior tubal ligatoin, post-ablation tubal sterilizaton syndrome (PATSS) may also occur with no reliable incidence rates as the majority of published literature is limited to case reports.   Prior to being considered a candidate for Novasure ablation she will need to undergo endometrial biopsy to rule out endometrial hyperplasia or malignancy as the cause of her bleeding, have an up to date pap on record, and undergo transvaginal ultrasound to verify the absence of focal endometrial lesion which may need to be addressed prior to proceeding with ablation.  In addition she is aware that the device is limited for use in women with a normal uterine cavity and uterine leiomyomata <3cm in size.  If present leiomyomata may increase the long-term failure rate of the procedure.  In rare instances the presence of a uterine septum or arcuate uterus, which may not be readily apparent on preoperative ultrasound, may necessitate the procedure to be aborted.    A total of 30 minutes were spent in face-to-face contact with the patient during this encounter with over half of that time devoted to counseling  and coordination of care.

## 2016-11-09 NOTE — Telephone Encounter (Signed)
-----   Message from Andreas Staebler, MD sent at 11/06/2016  6:52 PM EDT ----- °Regarding: Surgery °Surgery Date:  ° °LOS: outpatient ° °Surgery Booking Request °Patient Full Name: Helen Curry °MRN: [id number]  °DOB: 06/28/1975  °Surgeon: Andreas Staebler, MD  °Requested Surgery Date and Time: patient preference °Primary Diagnosis and Code: N92.1 °Secondary Diagnosis and Code: N82.202 °Surgical Procedure: Novasure endometrial ablation, diagnostic laparoscopy with possible left ovarian cystectomy °L&D Notification:N/A °Admission Status: same day surgery °Length of Surgery: 1h °Special Case Needs: novasure °H&P: can be same day as surgery (date) °Phone Interview or Office Pre-Admit: anesthesia preference °Interpreter: none °Language: English °Medical Clearance: none °Special Scheduling Instructions: N/A °

## 2016-11-09 NOTE — Telephone Encounter (Signed)
Patient is aware of H&P and consents day of surgery, Pre-admit Testing phone interview to be scheduled, and OR on 11/26/16. Patient is aware there are no surgical benefits on her policy but will contact her insurance company for options. Patient is aware of [facility]'s cost for Novasure endometrial ablation ONLY, as I could not pull the costs for the diagnostic laparoscopy and possible left ovarian cystectomy. Patient is aware I do not have access to estimated costs for hospital, anesthesia, lab, etc. Patient was given my ext.

## 2016-11-09 NOTE — Telephone Encounter (Deleted)
-----   Message from Vena Austria, MD sent at 11/06/2016  6:52 PM EDT ----- Regarding: Surgery Surgery Date:   LOS: outpatient  Surgery Booking Request Patient Full Name: Helen Curry MRN: [id number]  DOB: 06-15-1975  Surgeon: Vena Austria, MD  Requested Surgery Date and Time: patient preference Primary Diagnosis and Code: N92.1 Secondary Diagnosis and Code: N82.202 Surgical Procedure: Novasure endometrial ablation, diagnostic laparoscopy with possible left ovarian cystectomy L&D Notification:N/A Admission Status: same day surgery Length of Surgery: 1h Special Case Needs: novasure H&P: can be same day as surgery (date) Phone Interview or Office Pre-Admit: anesthesia preference Interpreter: none Language: English Medical Clearance: none Special Scheduling Instructions: N/A

## 2016-11-09 NOTE — Telephone Encounter (Signed)
Lmtrc

## 2016-11-11 ENCOUNTER — Encounter: Payer: Self-pay | Admitting: Obstetrics and Gynecology

## 2016-11-11 NOTE — Telephone Encounter (Signed)
Patient called and scheduled the in-office ablation. I contacted Leah to cancel surgery and pre-admit.

## 2016-11-18 ENCOUNTER — Inpatient Hospital Stay: Admission: RE | Admit: 2016-11-18 | Payer: Self-pay | Source: Ambulatory Visit

## 2016-11-24 ENCOUNTER — Encounter: Payer: Self-pay | Admitting: Obstetrics and Gynecology

## 2016-11-24 ENCOUNTER — Ambulatory Visit (INDEPENDENT_AMBULATORY_CARE_PROVIDER_SITE_OTHER): Payer: No Typology Code available for payment source | Admitting: Obstetrics and Gynecology

## 2016-11-24 VITALS — BP 108/54 | HR 91 | Ht 70.0 in | Wt 204.0 lb

## 2016-11-24 DIAGNOSIS — N946 Dysmenorrhea, unspecified: Secondary | ICD-10-CM

## 2016-11-24 DIAGNOSIS — N939 Abnormal uterine and vaginal bleeding, unspecified: Secondary | ICD-10-CM

## 2016-11-24 MED ORDER — MEPERIDINE HCL 50 MG PO TABS: 100.0000 mg | ORAL_TABLET | Freq: Once | ORAL | 0 refills | Status: AC

## 2016-11-24 MED ORDER — DIAZEPAM 5 MG PO TABS: 5.0000 mg | ORAL_TABLET | Freq: Once | ORAL | 0 refills | Status: AC

## 2016-11-24 MED ORDER — IBUPROFEN 800 MG PO TABS
800.0000 mg | ORAL_TABLET | Freq: Three times a day (TID) | ORAL | 0 refills | Status: AC | PRN
Start: 2016-11-24 — End: ?

## 2016-11-24 MED ORDER — PROMETHAZINE HCL 25 MG PO TABS: 50.0000 mg | ORAL_TABLET | Freq: Once | ORAL | 0 refills | Status: AC

## 2016-11-24 MED ORDER — MISOPROSTOL 200 MCG PO TABS: 200.0000 ug | ORAL_TABLET | Freq: Once | ORAL | 0 refills | Status: AC

## 2016-11-24 NOTE — Progress Notes (Signed)
Obstetrics & Gynecology Surgery H&P    Chief Complaint: Scheduled Surgery   History of Present Illness: Patient is a 41 y.o. [id number] presenting for scheduled in office Novasure endometrial ablation, for the treatment or further evaluation of abnormal uterine bleeding.   Prior Treatments prior to proceeding with surgery include: norethindrone po at 5mg   Preoperative Pap: 08/07/2016 Results: NIL HPV negative  Preoperative Endometrial biopsy: 09/08/2016  Findings: pathology secretory endometrium, benign endometrial polyp fragment with no evidence of polyp on ultrasound Preoperative Ultrasound: 09/08/2016 normal uterus, no evidence of fibroids, EMS of 11mm, no evidence of endometrial abnormalities, and indistinct endometrial/myometrial interface   Review of Systems:10 point review of systems  Past Medical History:  Past Medical History:  Diagnosis Date  . Abnormal uterine bleeding     Past Surgical History:  Past Surgical History:  Procedure Laterality Date  . PARATHYROIDECTOMY    . TONSILLECTOMY      Family History:  Family History  Problem Relation Age of Onset  . Dementia Father     Social History:  Social History   Social History  . Marital status: Married    Spouse name: N/A  . Number of children: N/A  . Years of education: N/A   Occupational History  . Not on file.   Social History Main Topics  . Smoking status: Current Some Day Smoker    Types: E-cigarettes  . Smokeless tobacco: Never Used  . Alcohol use Yes  . Drug use: No  . Sexual activity: Yes    Partners: Male    Birth control/ protection: Surgical     Comment: Vaesctomy   Other Topics Concern  . Not on file   Social History Narrative  . No narrative on file    Allergies:  Allergies  Allergen Reactions  . Sulfa Antibiotics Other (See Comments)    Childhood allergy    Medications: Prior to Admission medications   Medication Sig Start Date End Date Taking? Authorizing Provider    phentermine (ADIPEX-P) 37.5 MG tablet Take 1 tablet (37.5 mg total) by mouth daily before breakfast. Patient taking differently: Take 18.75 mg by mouth daily before breakfast.  10/21/16  Yes Vena AustriaStaebler, Urho Rio, MD  diazepam (VALIUM) 5 MG tablet Take 1 tablet (5 mg total) by mouth once. 1-hr prior to procedure 11/24/16 11/24/16  Vena AustriaStaebler, Emoree Sasaki, MD  ibuprofen (ADVIL,MOTRIN) 800 MG tablet Take 1 tablet (800 mg total) by mouth every 8 (eight) hours as needed for mild pain, moderate pain or cramping. 11/24/16   Vena AustriaStaebler, Apurva Reily, MD  meperidine (DEMEROL) 50 MG tablet Take 2 tablets (100 mg total) by mouth once. 1-hr prior to procedure 11/24/16 11/24/16  Vena AustriaStaebler, Undine Nealis, MD  misoprostol (CYTOTEC) 200 MCG tablet Place 1 tablet (200 mcg total) vaginally once. At bedtime evening prior to procedure 11/24/16 11/24/16  Vena AustriaStaebler, Amaan Meyer, MD  promethazine (PHENERGAN) 25 MG tablet Take 2 tablets (50 mg total) by mouth once. 1-hr prior to procedure 11/24/16 11/24/16  Vena AustriaStaebler, Javaris Wigington, MD    Physical Exam Vitals: Blood pressure (!) 108/54, pulse 91, height 5\' 10"  (1.778 m), weight 204 lb (92.5 kg), last menstrual period 10/26/2016.  General: NAD Pulmonary: no increased work of breathing HEENT: normocephalic, anicteric Neurologic: Grossly intact Psychiatric: mood appropriate, affect full  Imaging No results found.  Assessment: 41 y.o. G4W1027G3P2012 presenting for scheduled in office Novasure endometrial ablation  Plan: 1) The patient was counseled on the overall effectiveness of endometrial ablation in achieving amenorrhea.  She is aware that some patient may continue  to have menstrual cycles although these are generally greatly reduced in flow.  In addition she was quoted a failure rate for endometrial ablation of approximately 25% within the frist 4 years, but these failures may happen at any time during or after the initial 4 year postop period.  She is aware that pregnancy is contra-indicated in the  setting of prior endometrial ablation, and that ablation itself does not confer any contraceptive benefit.  She will therefore need to continue to rely on some means of contraception following the procedure.  Although rare and generally confined to patient who have undergone prior tubal ligatoin, post-ablation tubal sterilizaton syndrome (PATSS) may also occur with no reliable incidence rates as the majority of published literature is limited to case reports.   Prior to being considered a candidate for Novasure ablation she will need to undergo endometrial biopsy to rule out endometrial hyperplasia or malignancy as the cause of her bleeding, have an up to date pap on record, and undergo transvaginal ultrasound to verify the absence of focal endometrial lesion which may need to be addressed prior to proceeding with ablation.  In addition she is aware that the device is limited for use in women with a normal uterine cavity and uterine leiomyomata <3cm in size.  If present leiomyomata may increase the long-term failure rate of the procedure.  In rare instances the presence of a uterine septum or arcuate uterus, which may not be readily apparent on preoperative ultrasound, may necessitate the procedure to be aborted.    2) Routine postoperative instructions were reviewed with the patient and her family in detail today including the expected length of recovery and likely postoperative course.  The patient concurred with the proposed plan, giving informed written consent for the surgery today.  Patient instructed on the importance of being NPO after midnight prior to her procedure.  If warranted preoperative prophylactic antibiotics and SCDs ordered on call to the OR to meet SCIP guidelines and adhere to recommendation laid forth in ACOG Practice Bulletin Number 104 May 2009  "Antibiotic Prophylaxis for Gynecologic Procedures".

## 2016-11-26 ENCOUNTER — Ambulatory Visit: Admit: 2016-11-26 | Payer: PRIVATE HEALTH INSURANCE | Admitting: Obstetrics and Gynecology

## 2016-11-26 SURGERY — LAPAROSCOPY, DIAGNOSTIC
Anesthesia: Choice

## 2016-11-27 ENCOUNTER — Ambulatory Visit (INDEPENDENT_AMBULATORY_CARE_PROVIDER_SITE_OTHER): Payer: No Typology Code available for payment source | Admitting: Obstetrics and Gynecology

## 2016-11-27 ENCOUNTER — Ambulatory Visit: Payer: No Typology Code available for payment source | Admitting: Obstetrics and Gynecology

## 2016-11-27 VITALS — BP 112/60 | HR 81 | Ht 70.0 in | Wt 205.0 lb

## 2016-11-27 DIAGNOSIS — N924 Excessive bleeding in the premenopausal period: Secondary | ICD-10-CM

## 2016-11-27 NOTE — Progress Notes (Signed)
Duplicate encounter see other progress note

## 2016-11-27 NOTE — Progress Notes (Signed)
Preoperative Diagnosis: 1) 41 y.o. with menorrhagia and dysmenorrhea  Postoperative Diagnosis: 1) 41 y.o. with menorrhagia and dysmenorrhea  Operation Performed: Hysteroscopy, novasure endometrial ablation  Indication: The patient was previously counseled on the overall effectiveness of the device in achieving amenorrhea.  She is aware that some patient may continue to have menstrual cycles although these are generally greatly reduced in flow.  In addition she was quoted a failure rate for endometrial ablation of approximately 25% within the frist 4 years, but these failures may happen at any time during or after the initial 4 year postop period.  She is aware that pregnancy is contra-indicated in the setting of prior endometrial ablation, and that ablation itself does not confer and contraceptive benefits and that she will need to continue to rely on some means of contraception following the procedure.  Although rare and generally confined to patient who have undergone prior tubal ligatoin, post-ablation tubal sterilizaton syndrome (PATSS) may also occur.   Surgeon: Vena Austria, MD  Anesthesia: Valium and demerol  Preoperative Antibiotics: none  Estimated Blood Loss: minimal  Drains or Tubes: none  Implants: none  Specimens Removed: Endometrial curettings  Complications: none  Intraoperative Findings: Normal appearing vagina mucosa and cervix.  Normal uterine cavity.  Good coverage of the uterine cavity on post-tablation hysteroscopy with ablation of the uterine cornua and sparing of the endocervix.   Patient Condition: stable  Procedure in Detail:  FINDINGS: Exam under anesthesia revealed small, mobile uterus with no masses and bilateral adnexa without masses or fullness. Hysteroscopy revealed no evidence of polyp (suspected after endometrial biopsy), otherwise grossly normal appearing uterine cavity with bilateral tubal ostia and normal appearing endocervical canal. Ablation  was aborted secondary to patient discomfort trying to deploy the novasure, a repeat hysteroscopy revealed once again normal findings.  PROCEDURE IN DETAIL: After informed consent was obtained, the patient was taken to the operating room where anesthesia was obtained without difficulty. The patient was positioned in the dorsal lithotomy position using stirrups.  The patient was examined under anesthesia, with the above noted findings.  An operative speculum was placed inside the patient's vagina, the cervix was adequately visualized, 10mL of 1% lidocaine was injected into the cervical stroma for a paracervical block.  The anterior lip of the cervix was grasped with a single tooth tenaculum. The uterine cavity was sounded to 9 cm.  The Endosee  hysteroscope was introduced through the cervix and advanced under direct visualization in the uterine cavity.  Normal saline was used as the distending fluid during the hysteroscopy.  This revealed the above noted intracavitary findings. The cervix was progressively dilated to 8mm using Pratt dilators.  The patient had significant pain with dilation The cervical length was obtained and noted to be 5cm.  This measurement yielded a total cavity length of 4cm based on the previously obtained sounding measurement   The NovaSure device was then  placed but was unable to be passed past the internal cervical os, despite repeat attempt at dilation. Given patient discomfort and the inability to dilate the cervix sufficiently to deploy the Novasure device the procedure was aborted as risk of perforation was deemed to great.  The Endosee was reintroduced in into the lower uterine segment noting normal findings.   Tenaculum was removed with excellent hemostasis noted after application of silver nitrate to the tenaculum entry sites. She was then taken out of dorsal lithotomy. Hemostasis noted.  The patient did not tolerated the procedure well, but was feeling well at  the conclusion  once procedure was aborted. Sponge, lap and needle counts were correct times two. The patient was observed postoperatively with no apparent complication.    I will contact the patient following later this evening once her analgesic agents have worn off to discuss possible follow up scenarios given inability to perform the Novasure in office.  I do feel she has an adequate cavity to accommodate the Novasure but would be more comfortable proceeding with the cervical dilation under anesthesia.  Alternative is proceeding to hysterectomy.

## 2017-10-04 ENCOUNTER — Other Ambulatory Visit: Payer: Self-pay | Admitting: Obstetrics and Gynecology

## 2017-10-04 DIAGNOSIS — Z1231 Encounter for screening mammogram for malignant neoplasm of breast: Secondary | ICD-10-CM

## 2017-10-22 ENCOUNTER — Ambulatory Visit
Admission: RE | Admit: 2017-10-22 | Discharge: 2017-10-22 | Disposition: A | Discharge status: Home / Self Care | Payer: Commercial Indemnity | Source: Ambulatory Visit | Attending: Obstetrics and Gynecology | Admitting: Obstetrics and Gynecology

## 2017-10-22 DIAGNOSIS — Z1231 Encounter for screening mammogram for malignant neoplasm of breast: Secondary | ICD-10-CM | POA: Insufficient documentation

## 2018-08-09 ENCOUNTER — Telehealth: Payer: Self-pay

## 2018-08-09 NOTE — Telephone Encounter (Signed)
Pt calling to see if she can get an order for covid 19.  There is a possible exposure at her part time job she worked over the weekend.  She has an appt tomorrow at [facility] but can get it done today at [facility] IF she can get an order and be there before 3:45.  [phone]

## 2021-04-01 ENCOUNTER — Other Ambulatory Visit: Payer: Self-pay | Admitting: Obstetrics and Gynecology

## 2021-04-01 DIAGNOSIS — Z1231 Encounter for screening mammogram for malignant neoplasm of breast: Secondary | ICD-10-CM

## 2021-04-02 ENCOUNTER — Ambulatory Visit
Admission: RE | Admit: 2021-04-02 | Discharge: 2021-04-02 | Disposition: A | Discharge status: Home / Self Care | Payer: PRIVATE HEALTH INSURANCE | Source: Ambulatory Visit | Attending: Obstetrics and Gynecology | Admitting: Obstetrics and Gynecology

## 2021-04-02 ENCOUNTER — Other Ambulatory Visit: Payer: Self-pay

## 2021-04-02 DIAGNOSIS — Z1231 Encounter for screening mammogram for malignant neoplasm of breast: Secondary | ICD-10-CM | POA: Insufficient documentation

## 2022-07-09 IMAGING — MG MM DIGITAL SCREENING BILAT W/ TOMO AND CAD
8 series · 8 of 24 positions shown · non-contrast
Comparison: Previous exam(s).

CLINICAL DATA: Screening.

EXAM:
DIGITAL SCREENING BILATERAL MAMMOGRAM WITH TOMOSYNTHESIS AND CAD
TECHNIQUE: Bilateral screening digital craniocaudal and mediolateral oblique
mammograms were obtained. Bilateral screening digital breast
tomosynthesis was performed. The images were evaluated with
computer-aided detection.

[R CC synth-2D]
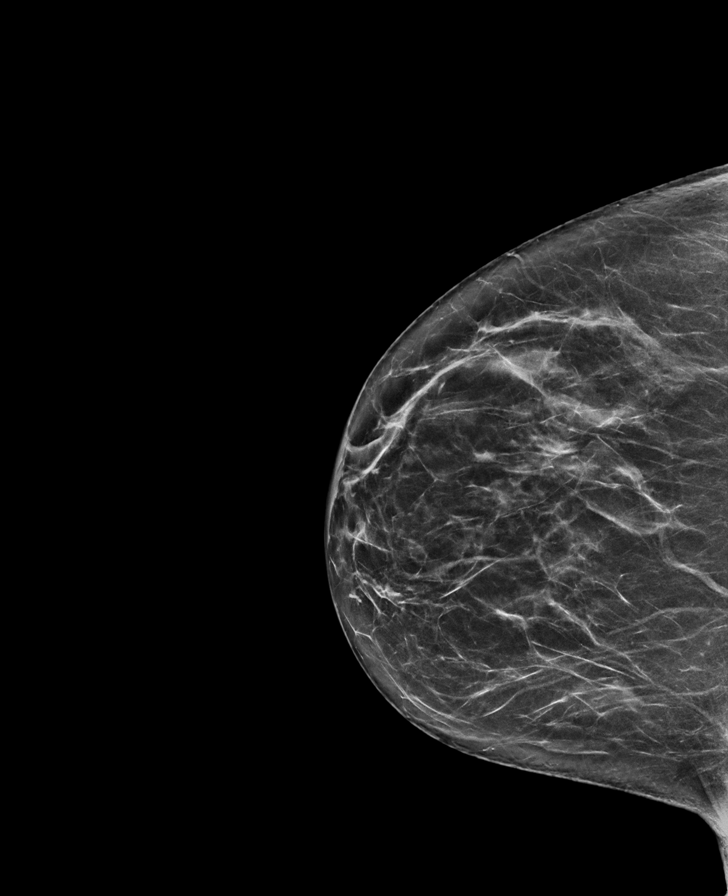

[L CC synth-2D]
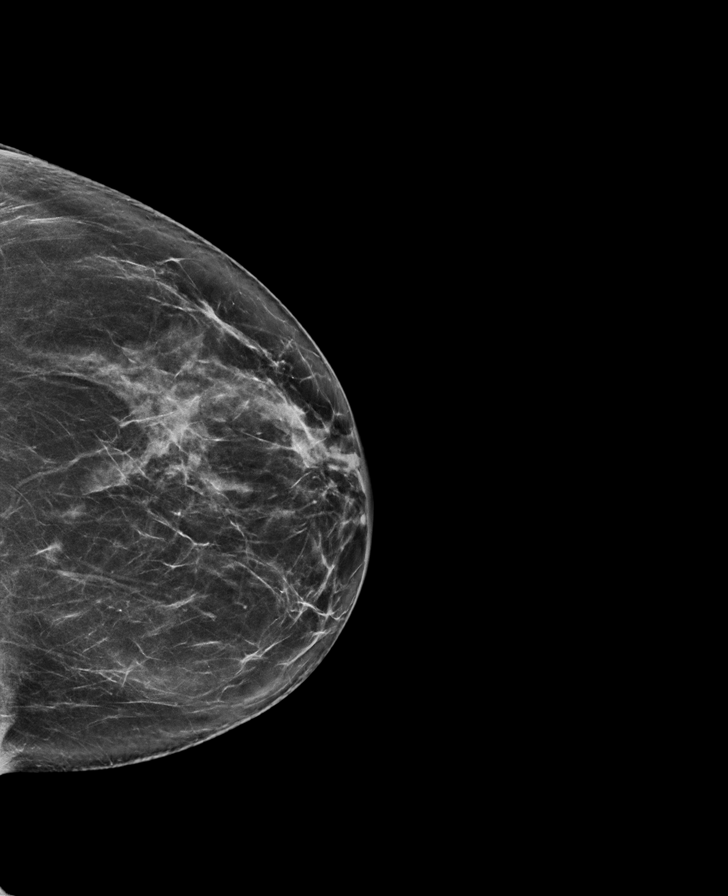

[R MLO synth-2D]
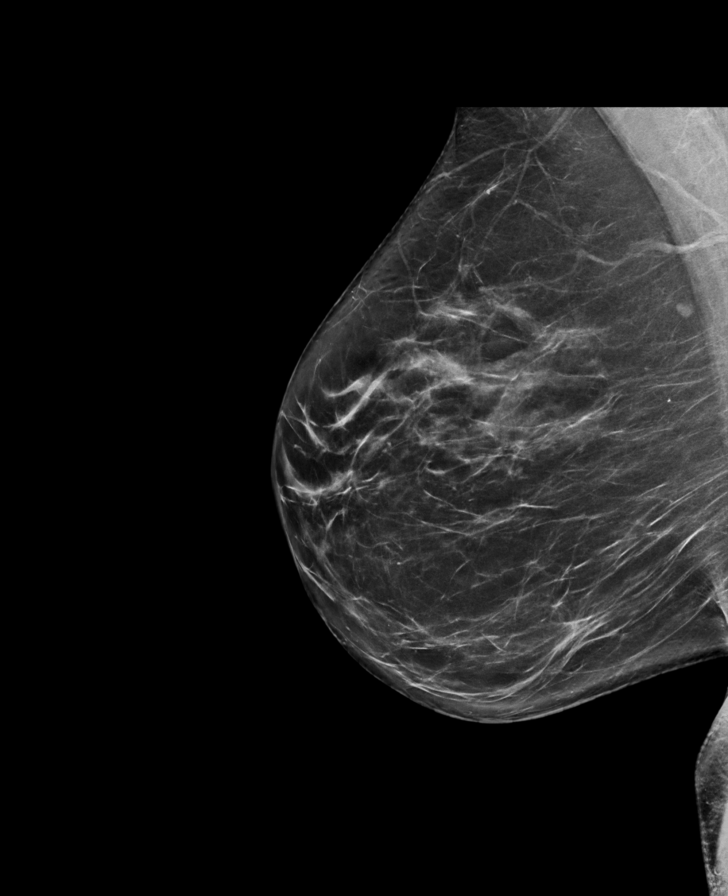

[L MLO synth-2D]
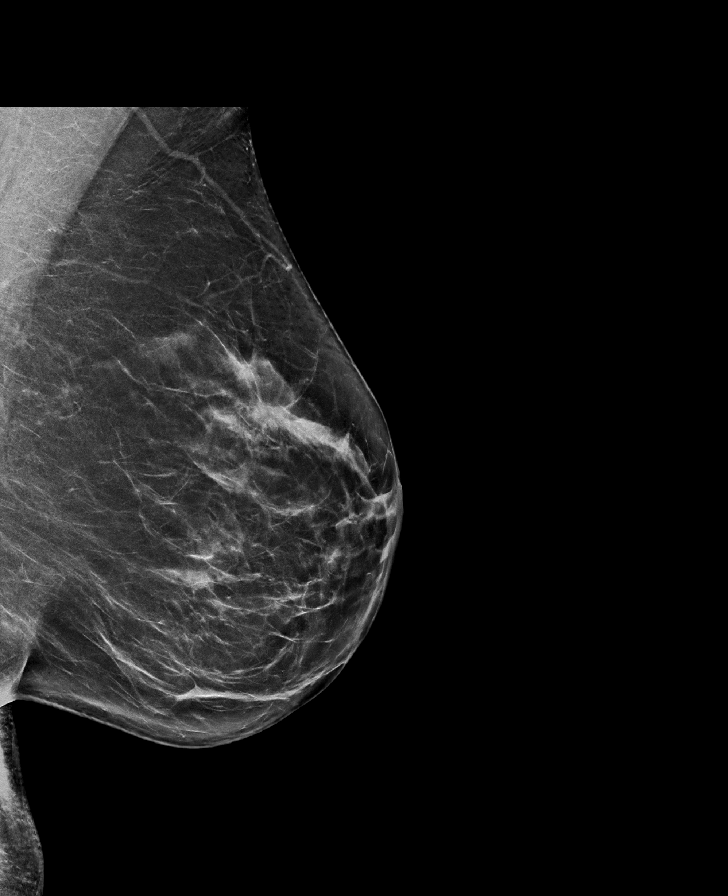

[L MLO tomo · tomo slice 40/79.0]
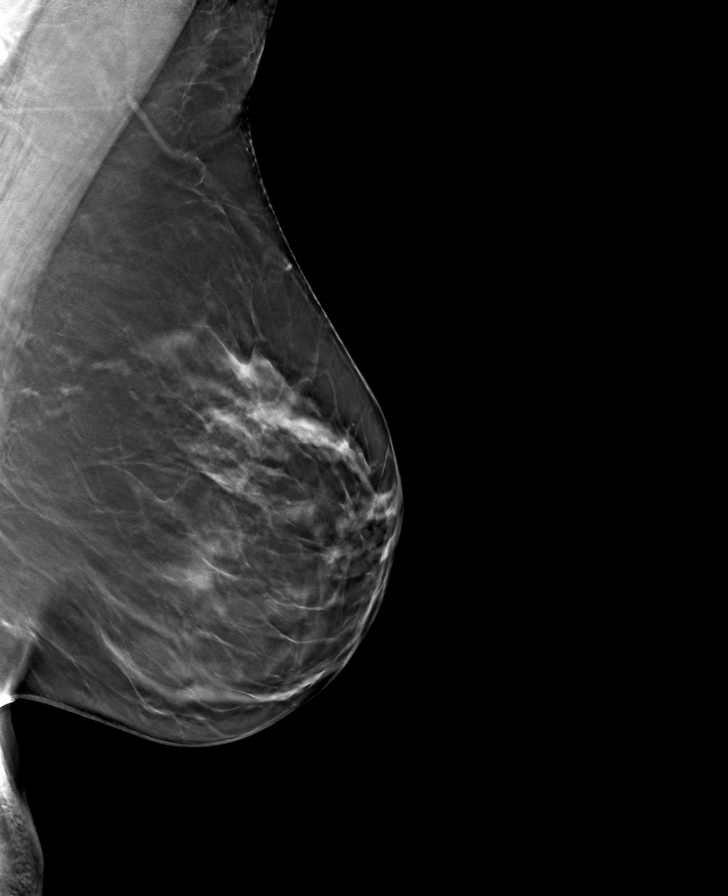

[R CC tomo · tomo slice 37/73.0]
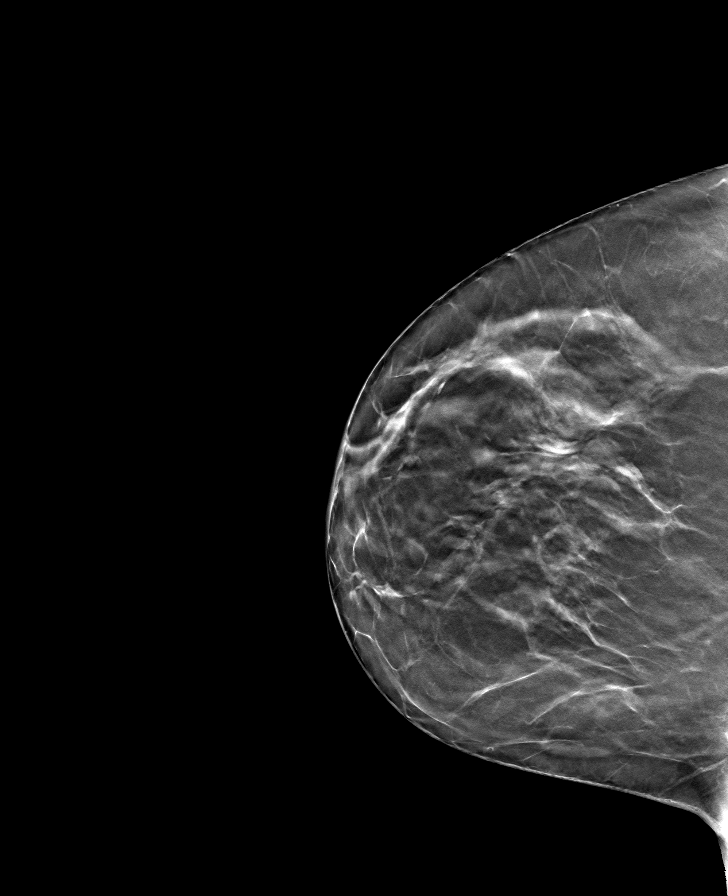

[L CC tomo · tomo slice 37/74.0]
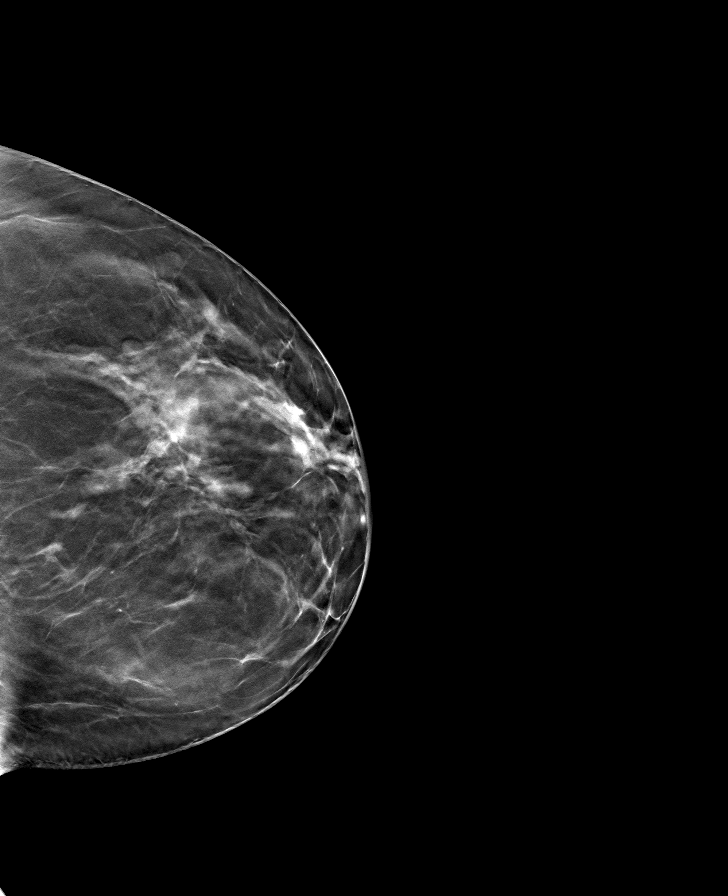

[R MLO tomo · tomo slice 39/77.0]
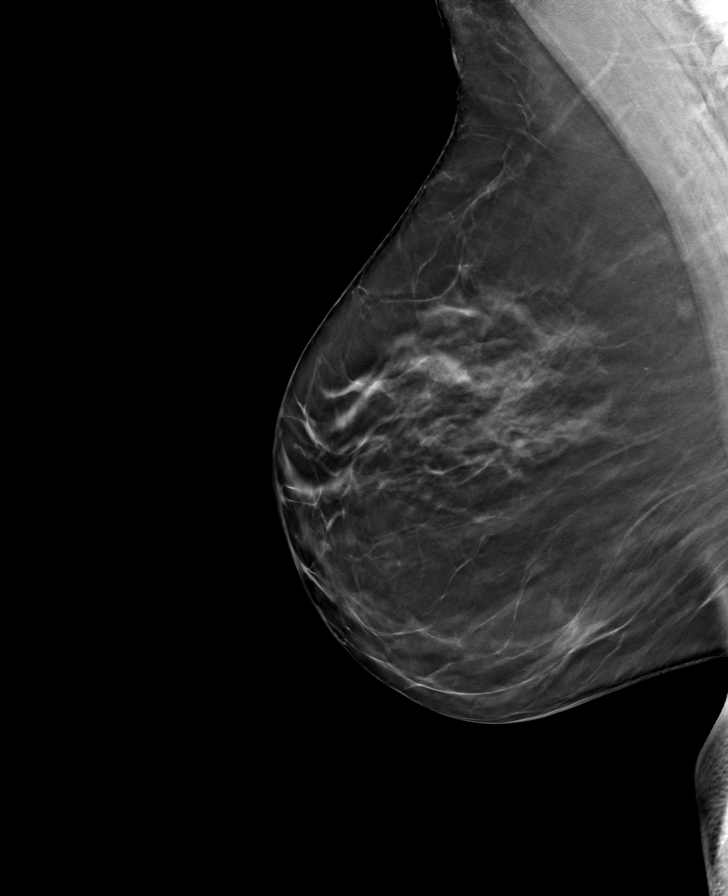

[8 of 24 positions shown; findings below may reference images not displayed]

ACR Breast Density Category c: The breast tissue is heterogeneously
dense, which may obscure small masses.
FINDINGS: There are no findings suspicious for malignancy.
IMPRESSION: No mammographic evidence of malignancy. A result letter of this
screening mammogram will be mailed directly to the patient.

RECOMMENDATION:
Screening mammogram in one year. (Code:Q3-W-BC3)

BI-RADS CATEGORY  1: Negative.

## 2023-08-05 ENCOUNTER — Emergency Department
Admission: EM | Admit: 2023-08-05 | Discharge: 2023-08-05 | Disposition: A | Discharge status: Home / Self Care | Payer: Self-pay | Attending: Emergency Medicine | Admitting: Emergency Medicine

## 2023-08-05 ENCOUNTER — Other Ambulatory Visit: Payer: Self-pay

## 2023-08-05 ENCOUNTER — Emergency Department: Payer: Self-pay

## 2023-08-05 ENCOUNTER — Telehealth: Payer: Self-pay

## 2023-08-05 DIAGNOSIS — R8271 Bacteriuria: Secondary | ICD-10-CM | POA: Insufficient documentation

## 2023-08-05 DIAGNOSIS — N200 Calculus of kidney: Secondary | ICD-10-CM

## 2023-08-05 DIAGNOSIS — N132 Hydronephrosis with renal and ureteral calculous obstruction: Secondary | ICD-10-CM | POA: Insufficient documentation

## 2023-08-05 LAB — CBC
HCT: 43.5 % (ref 36.0–46.0)
Hemoglobin: 14.7 g/dL (ref 12.0–15.0)
MCH: 29.8 pg (ref 26.0–34.0)
MCHC: 33.8 g/dL (ref 30.0–36.0)
MCV: 88.2 fL (ref 80.0–100.0)
Platelets: 257 K/uL (ref 150–400)
RBC: 4.93 MIL/uL (ref 3.87–5.11)
RDW: 13.3 % (ref 11.5–15.5)
WBC: 7.8 K/uL (ref 4.0–10.5)
nRBC: 0 % (ref 0.0–0.2)

## 2023-08-05 LAB — URINALYSIS, ROUTINE W REFLEX MICROSCOPIC
Bilirubin Urine: NEGATIVE
Glucose, UA: NEGATIVE mg/dL
Hgb urine dipstick: NEGATIVE
Ketones, ur: 20 mg/dL — AB
Nitrite: NEGATIVE
Protein, ur: NEGATIVE mg/dL
Specific Gravity, Urine: 1.013 (ref 1.005–1.030)
pH: 8 (ref 5.0–8.0)

## 2023-08-05 LAB — BASIC METABOLIC PANEL WITH GFR
Anion gap: 12 (ref 5–15)
BUN: 21 mg/dL — ABNORMAL HIGH (ref 6–20)
CO2: 21 mmol/L — ABNORMAL LOW (ref 22–32)
Calcium: 9.5 mg/dL (ref 8.9–10.3)
Chloride: 105 mmol/L (ref 98–111)
Creatinine, Ser: 0.71 mg/dL (ref 0.44–1.00)
GFR, Estimated: >60 mL/min (ref >60)
Glucose, Bld: 146 mg/dL — ABNORMAL HIGH (ref 70–99)
Potassium: 3.1 mmol/L — ABNORMAL LOW (ref 3.5–5.1)
Sodium: 138 mmol/L (ref 135–145)

## 2023-08-05 LAB — HEPATIC FUNCTION PANEL
ALT: 15 U/L (ref 0–44)
AST: 19 U/L (ref 15–41)
Albumin: 3.9 g/dL (ref 3.5–5.0)
Alkaline Phosphatase: 73 U/L (ref 38–126)
Bilirubin, Direct: <0.1 mg/dL (ref 0.0–0.2)
Total Bilirubin: 1 mg/dL (ref 0.0–1.2)
Total Protein: 6.6 g/dL (ref 6.5–8.1)

## 2023-08-05 LAB — POC URINE PREG, ED: Preg Test, Ur: NEGATIVE

## 2023-08-05 LAB — LIPASE, BLOOD: Lipase: 30 U/L (ref 11–51)

## 2023-08-05 MED ORDER — CEFDINIR 300 MG PO CAPS
300.0000 mg | ORAL_CAPSULE | Freq: Two times a day (BID) | ORAL | 0 refills | Status: AC
Start: 2023-08-05 — End: 2023-08-15

## 2023-08-05 MED ORDER — OXYCODONE-ACETAMINOPHEN 5-325 MG PO TABS
1.0000 | ORAL_TABLET | ORAL | Status: DC | PRN
  Administered 2023-08-05: 1 via ORAL
  Filled 2023-08-05: qty 1

## 2023-08-05 MED ORDER — OXYCODONE HCL 5 MG PO TABS: 5.0000 mg | ORAL_TABLET | Freq: Three times a day (TID) | ORAL | 0 refills | Status: DC | PRN

## 2023-08-05 MED ORDER — KETOROLAC TROMETHAMINE 15 MG/ML IJ SOLN
30.0000 mg | Freq: Once | INTRAMUSCULAR | Status: AC
  Administered 2023-08-05: 30 mg via INTRAMUSCULAR
  Filled 2023-08-05: qty 2

## 2023-08-05 MED ORDER — OXYCODONE HCL 5 MG PO TABS: 5.0000 mg | ORAL_TABLET | Freq: Three times a day (TID) | ORAL | 0 refills | Status: AC | PRN

## 2023-08-05 MED ORDER — ACETAMINOPHEN 500 MG PO TABS
1000.0000 mg | ORAL_TABLET | Freq: Once | ORAL | Status: AC
  Administered 2023-08-05: 500 mg via ORAL
  Filled 2023-08-05: qty 2

## 2023-08-05 MED ORDER — CEPHALEXIN 500 MG PO CAPS
500.0000 mg | ORAL_CAPSULE | Freq: Once | ORAL | Status: AC
  Administered 2023-08-05: 500 mg via ORAL
  Filled 2023-08-05: qty 1

## 2023-08-05 MED ORDER — ONDANSETRON 4 MG PO TBDP
4.0000 mg | ORAL_TABLET | Freq: Once | ORAL | Status: AC
  Administered 2023-08-05: 4 mg via ORAL
  Filled 2023-08-05: qty 1

## 2023-08-05 MED ORDER — TAMSULOSIN HCL 0.4 MG PO CAPS: 0.4000 mg | ORAL_CAPSULE | Freq: Every day | ORAL | 0 refills | Status: AC

## 2023-08-05 NOTE — ED Notes (Signed)
Patient walked independently to the bathroom.

## 2023-08-05 NOTE — ED Triage Notes (Signed)
 Pt presents via POV c/o right sided flank/lower right abd pain. No tenderness to palpation on exam.

## 2023-08-05 NOTE — ED Notes (Signed)
 Patient states her pain has decreased. Also states she feels some burning when she voids. Advised it may be related to her symptoms and possible urinary tract infection. Husband remains at bedside.

## 2023-08-05 NOTE — Discharge Instructions (Addendum)
 Take cefdinir  antibiotic for the full 10-day course.  Follow-up with your primary doctor or Dr. Twylla of urology to check the results of the urine culture and for further guidance on the antibiotics  Take acetaminophen  650 mg and ibuprofen  400 mg every 6 hours for pain.  Take with food. Take oxycodone  for severe pain only.  Take Flomax  daily as prescribed.

## 2023-08-05 NOTE — ED Provider Notes (Signed)
 [facility] Provider Note    Event Date/Time   First MD Initiated Contact with Patient 08/05/23 0402     (approximate)   History   Flank Pain   HPI  Helen Curry is a 48 y.o. female   Past medical history of no significant past medical history presents the emergency department with acute onset right flank pain rating down to the groin with no urinary symptoms like frequency or dysuria.  No fevers no chills.  Never had a kidney stone or pain like this in the past.  No trauma.  Independent Historian contributed to assessment above: Husband corroborates information above    Physical Exam   Triage Vital Signs: ED Triage Vitals  Encounter Vitals Group     BP 08/05/23 0121 122/82     Girls Systolic BP Percentile --      Girls Diastolic BP Percentile --      Boys Systolic BP Percentile --      Boys Diastolic BP Percentile --      Pulse Rate 08/05/23 0121 (!) 50     Resp 08/05/23 0121 18     Temp --      Temp src --      SpO2 08/05/23 0121 100 %     Weight --      Height --      Head Circumference --      Peak Flow --      Pain Score 08/05/23 0122 8     Pain Loc --      Pain Education --      Exclude from Growth Chart --     Most recent vital signs: Vitals:   08/05/23 0414 08/05/23 0537  BP:  111/72  Pulse:  (!) 51  Resp:  15  Temp:  97.6 F (36.4 C)  SpO2: 100% 100%    General: Awake, no distress.  CV:  Good peripheral perfusion. Resp:  Normal effort.  Abd:  No distention.  Other:  Cannot seem to get in a comfortable position in the stretcher, writhing, with right CVA tenderness and a benign abdominal exam.   ED Results / Procedures / Treatments   Labs (all labs ordered are listed, but only abnormal results are displayed) Labs Reviewed  URINALYSIS, ROUTINE W REFLEX MICROSCOPIC - Abnormal; Notable for the following components:      Result Value   Color, Urine YELLOW (*)    APPearance HAZY (*)    Ketones, ur 20 (*)     Leukocytes,Ua LARGE (*)    Bacteria, UA FEW (*)    All other components within normal limits  BASIC METABOLIC PANEL WITH GFR - Abnormal; Notable for the following components:   Potassium 3.1 (*)    CO2 21 (*)    Glucose, Bld 146 (*)    BUN 21 (*)    All other components within normal limits  URINE CULTURE  CBC  HEPATIC FUNCTION PANEL  LIPASE, BLOOD  POC URINE PREG, ED     I ordered and reviewed the above labs they are notable for she has few bacteria and large leukocytes in the urine.  Kidney function normal.      RADIOLOGY I independently reviewed and interpreted CT scan of the abdomen pelvis and see a hyperdensity in the right lower quadrant concerning for ureterolithiasis I also reviewed radiologist's formal read.   PROCEDURES:  Critical Care performed: No  Procedures   MEDICATIONS ORDERED IN ED: Medications  oxyCODONE -acetaminophen  (PERCOCET/ROXICET)  5-325 MG per tablet 1 tablet (1 tablet Oral Given 08/05/23 0126)  ondansetron  (ZOFRAN -ODT) disintegrating tablet 4 mg (4 mg Oral Given 08/05/23 0127)  ketorolac  (TORADOL ) 15 MG/ML injection 30 mg (30 mg Intramuscular Given 08/05/23 0406)  acetaminophen  (TYLENOL ) tablet 1,000 mg (500 mg Oral Given 08/05/23 0406)  cephALEXin  (KEFLEX ) capsule 500 mg (500 mg Oral Given 08/05/23 0409)     IMPRESSION / MDM / ASSESSMENT AND PLAN / ED COURSE  I reviewed the triage vital signs and the nursing notes.                                Patient's presentation is most consistent with acute presentation with potential threat to life or bodily function.  Differential diagnosis includes, but is not limited to, renal colic, obstructive uropathy, kidney stone, pyelonephritis urinary tract infection, intra-abdominal infection like appendicitis   The patient is on the cardiac monitor to evaluate for evidence of arrhythmia and/or significant heart rate changes.  MDM:    Patient with renal colic, pain well-controlled with medications  given in the emergency department.  She does have a 3 mm stone diagnosed on CT scan.  She has no urinary symptoms but does have some bacteriuria and leukocytes in the urine so I will cover with cefdinir .  Ordered a urine culture.  She does not appear septic.  She appears much more comfortable after pain medications.    I considered admission however given her pain is very well-controlled she does not appear septic, I think she can manage as an outpatient and will have her call urologist for follow-up appointment return precautions given.        FINAL CLINICAL IMPRESSION(S) / ED DIAGNOSES   Final diagnoses:  Kidney stone  Bacteria in urine     Rx / DC Orders   ED Discharge Orders          Ordered    cefdinir  (OMNICEF ) 300 MG capsule  2 times daily        08/05/23 0405    tamsulosin  (FLOMAX ) 0.4 MG CAPS capsule  Daily        08/05/23 0405    oxyCODONE  (ROXICODONE ) 5 MG immediate release tablet  Every 8 hours PRN        08/05/23 0405             Note:  This document was prepared using Dragon voice recognition software and may include unintentional dictation errors.    Cyrena Mylar, MD 08/05/23 [phone]

## 2023-08-05 NOTE — Telephone Encounter (Signed)
 Notified by ED secretary and RN that oxycodone  prescribed last night for urolithiasis was unfortunately not appropriately transmitted to pharmacy.  Other medications were.  Creating new encounter in epic to represcribe medication.

## 2023-08-06 LAB — URINE CULTURE: Culture: 80000 — AB

## 2023-09-10 ENCOUNTER — Ambulatory Visit: Admitting: Urology
